# Patient Record
Sex: Female | Born: 1955 | Race: White | Hispanic: No | Smoking: Never smoker
Health system: Southern US, Community
[De-identification: ages and names within clinical notes are randomized; demographics above are authoritative.]

## PROBLEM LIST (undated history)

## (undated) DIAGNOSIS — F32A Depression, unspecified: Secondary | ICD-10-CM

## (undated) DIAGNOSIS — D696 Thrombocytopenia, unspecified: Secondary | ICD-10-CM

## (undated) DIAGNOSIS — N12 Tubulo-interstitial nephritis, not specified as acute or chronic: Secondary | ICD-10-CM

## (undated) DIAGNOSIS — F419 Anxiety disorder, unspecified: Secondary | ICD-10-CM

## (undated) DIAGNOSIS — K219 Gastro-esophageal reflux disease without esophagitis: Secondary | ICD-10-CM

## (undated) DIAGNOSIS — F329 Major depressive disorder, single episode, unspecified: Secondary | ICD-10-CM

## (undated) DIAGNOSIS — H547 Unspecified visual loss: Secondary | ICD-10-CM

## (undated) DIAGNOSIS — M810 Age-related osteoporosis without current pathological fracture: Secondary | ICD-10-CM

## (undated) DIAGNOSIS — E119 Type 2 diabetes mellitus without complications: Secondary | ICD-10-CM

## (undated) DIAGNOSIS — Z8719 Personal history of other diseases of the digestive system: Secondary | ICD-10-CM

## (undated) DIAGNOSIS — J302 Other seasonal allergic rhinitis: Secondary | ICD-10-CM

## (undated) DIAGNOSIS — G709 Myoneural disorder, unspecified: Secondary | ICD-10-CM

## (undated) DIAGNOSIS — R519 Headache, unspecified: Secondary | ICD-10-CM

## (undated) DIAGNOSIS — M1990 Unspecified osteoarthritis, unspecified site: Secondary | ICD-10-CM

## (undated) DIAGNOSIS — I10 Essential (primary) hypertension: Secondary | ICD-10-CM

## (undated) DIAGNOSIS — I639 Cerebral infarction, unspecified: Secondary | ICD-10-CM

## (undated) DIAGNOSIS — J189 Pneumonia, unspecified organism: Secondary | ICD-10-CM

## (undated) DIAGNOSIS — I4719 Other supraventricular tachycardia: Secondary | ICD-10-CM

## (undated) DIAGNOSIS — G250 Essential tremor: Secondary | ICD-10-CM

## (undated) DIAGNOSIS — E785 Hyperlipidemia, unspecified: Secondary | ICD-10-CM

## (undated) HISTORY — PX: CARDIAC VALVE REPLACEMENT: SHX585

## (undated) HISTORY — PX: OTHER SURGICAL HISTORY: SHX169

## (undated) HISTORY — PX: FRACTURE SURGERY: SHX138

## (undated) HISTORY — PX: BREAST CYST ASPIRATION: SHX578

## (undated) HISTORY — PX: TUBAL LIGATION: SHX77

---

## 2004-11-12 HISTORY — PX: CARDIAC VALVE REPLACEMENT: SHX585

## 2005-03-12 ENCOUNTER — Ambulatory Visit: Payer: Self-pay | Admitting: Internal Medicine

## 2005-03-30 ENCOUNTER — Emergency Department: Payer: Self-pay | Admitting: Internal Medicine

## 2005-11-21 ENCOUNTER — Ambulatory Visit: Payer: Self-pay

## 2005-11-26 ENCOUNTER — Ambulatory Visit: Payer: Self-pay

## 2005-12-21 ENCOUNTER — Other Ambulatory Visit: Payer: Self-pay

## 2005-12-25 ENCOUNTER — Ambulatory Visit: Payer: Self-pay | Admitting: Obstetrics & Gynecology

## 2008-06-15 ENCOUNTER — Ambulatory Visit: Payer: Self-pay | Admitting: Internal Medicine

## 2009-01-17 ENCOUNTER — Encounter: Payer: Self-pay | Admitting: Orthopedic Surgery

## 2009-02-10 ENCOUNTER — Encounter: Payer: Self-pay | Admitting: Orthopedic Surgery

## 2011-06-25 ENCOUNTER — Ambulatory Visit: Payer: Self-pay | Admitting: Family Medicine

## 2012-08-06 ENCOUNTER — Ambulatory Visit (HOSPITAL_COMMUNITY)
Admission: RE | Admit: 2012-08-06 | Discharge: 2012-08-06 | Disposition: A | Discharge status: Home / Self Care | Payer: BC Managed Care – PPO | Source: Ambulatory Visit | Attending: Family Medicine | Admitting: Family Medicine

## 2012-08-06 ENCOUNTER — Ambulatory Visit (HOSPITAL_COMMUNITY): Payer: Self-pay

## 2012-08-06 ENCOUNTER — Other Ambulatory Visit (HOSPITAL_COMMUNITY): Payer: Self-pay | Admitting: Family Medicine

## 2012-08-06 ENCOUNTER — Other Ambulatory Visit (HOSPITAL_COMMUNITY): Payer: Self-pay | Admitting: General Practice

## 2012-08-06 DIAGNOSIS — R1084 Generalized abdominal pain: Secondary | ICD-10-CM

## 2012-08-06 DIAGNOSIS — R109 Unspecified abdominal pain: Secondary | ICD-10-CM

## 2012-08-07 ENCOUNTER — Other Ambulatory Visit (HOSPITAL_COMMUNITY): Payer: Self-pay | Admitting: Internal Medicine

## 2012-08-08 ENCOUNTER — Ambulatory Visit (HOSPITAL_COMMUNITY): Payer: BC Managed Care – PPO

## 2012-08-08 ENCOUNTER — Other Ambulatory Visit (HOSPITAL_COMMUNITY): Payer: BC Managed Care – PPO

## 2012-08-08 ENCOUNTER — Other Ambulatory Visit (HOSPITAL_COMMUNITY): Payer: Self-pay

## 2012-09-04 ENCOUNTER — Telehealth: Payer: Self-pay

## 2012-09-04 NOTE — Telephone Encounter (Signed)
LMOM to call.

## 2012-09-22 NOTE — Telephone Encounter (Signed)
LMOM to call.

## 2012-09-23 NOTE — Telephone Encounter (Signed)
Letter to pt and PCP.  

## 2013-11-16 ENCOUNTER — Telehealth: Payer: Self-pay

## 2013-11-16 NOTE — Telephone Encounter (Signed)
Pt was referred by Dr. Georganna [facility]Amelia Wilson [facility] for [facility]. Home number many rings and no answer. LMOM for a return call on [phone].

## 2013-11-30 ENCOUNTER — Telehealth: Payer: Self-pay | Admitting: *Deleted

## 2013-11-30 NOTE — Telephone Encounter (Signed)
Pt called wanting to schedule her colonoscopy. Please advise [phone] or cell [phone] [facility] if she does not answer.

## 2013-12-04 NOTE — Telephone Encounter (Signed)
LMOM to call.

## 2013-12-08 NOTE — Telephone Encounter (Signed)
LMOM to call on both numbers.

## 2013-12-10 ENCOUNTER — Other Ambulatory Visit: Payer: Self-pay

## 2013-12-10 DIAGNOSIS — Z1211 Encounter for screening for malignant neoplasm of colon: Secondary | ICD-10-CM

## 2013-12-10 NOTE — Telephone Encounter (Signed)
Returned pt's call. LMOM for a return call.

## 2013-12-11 NOTE — Telephone Encounter (Signed)
MOVI PREP SPLIT DOSING, REGULAR BREAKFAST. CLEAR LIQUIDS AFTER 9 AM.  

## 2013-12-11 NOTE — Telephone Encounter (Signed)
Gastroenterology Pre-Procedure Review  Request Date: 12/10/2013 Requesting Physician: Dr. Georganna SkeansAmelia Wilson at [facility] Pt's first colonoscopy  PATIENT REVIEW QUESTIONS: The patient responded to the following health history questions as indicated:    1. Diabetes Melitis: no 2. Joint replacements in the past 12 months: no 3. Major health problems in the past 3 months: no 4. Has an artificial valve or MVP: pt had mitral valve repair in 2006 5. Has a defibrillator: no 6. Has been advised in past to take antibiotics in advance of a procedure like teeth cleaning: Yes in the past, but no longer    MEDICATIONS & ALLERGIES:    Patient reports the following regarding taking any blood thinners:   Plavix? no Aspirin? YES Coumadin? no  Patient confirms/reports the following medications:  Current Outpatient Prescriptions  Medication Sig Dispense Refill  . alendronate (FOSAMAX) 70 MG tablet Take 70 mg by mouth once a week. Take with a full glass of water on an empty stomach.      Marland Kitchen. aspirin 81 MG tablet Take 81 mg by mouth daily.      . benazepril (LOTENSIN) 40 MG tablet Take 40 mg by mouth daily.      Marland Kitchen. loratadine (CLARITIN) 10 MG tablet Take 10 mg by mouth daily.      . Multiple Vitamin (MULTIVITAMIN) tablet Take 1 tablet by mouth daily.      . NON FORMULARY Calcium 600 + D    BID      . Omega-3 Fatty Acids (FISH OIL) 1000 MG CPDR Take by mouth 2 (two) times daily.      . pindolol (VISKEN) 5 MG tablet Take 5 mg by mouth 2 (two) times daily.      . sertraline (ZOLOFT) 100 MG tablet Take 100 mg by mouth daily.      . simvastatin (ZOCOR) 40 MG tablet Take 40 mg by mouth daily.       No current facility-administered medications for this visit.    Patient confirms/reports the following allergies:  No Known Allergies  No orders of the defined types were placed in this encounter.    AUTHORIZATION INFORMATION Primary Insurance:   ID #: Group #:  Pre-Cert / Auth required:   Pre-Cert / Auth #:   Secondary Insurance:   ID #:   Group #:  Pre-Cert / Auth required: Pre-Cert / Auth #:   SCHEDULE INFORMATION: Procedure has been scheduled as follows:  Date:  12/28/2013          Time:  10:45 AM Location: [facility] short Stay  This Gastroenterology Pre-Precedure Review Form is being routed to the following provider(s): Jonette EvaSandi Fields, MD

## 2013-12-14 MED ORDER — PEG-KCL-NACL-NASULF-NA ASC-C 100 G PO SOLR: 1.0000 | ORAL | Status: DC

## 2013-12-14 NOTE — Telephone Encounter (Signed)
See separate triage.  

## 2013-12-14 NOTE — Telephone Encounter (Signed)
Rx sent to the pharmacy and instructions mailed to pt.  

## 2013-12-21 ENCOUNTER — Encounter (HOSPITAL_COMMUNITY): Payer: Self-pay | Admitting: Pharmacy Technician

## 2013-12-28 ENCOUNTER — Ambulatory Visit (HOSPITAL_COMMUNITY)
Admission: RE | Admit: 2013-12-28 | Discharge: 2013-12-28 | Disposition: A | Discharge status: Home / Self Care | Payer: BC Managed Care – PPO | Source: Ambulatory Visit | Attending: Gastroenterology | Admitting: Gastroenterology

## 2013-12-28 ENCOUNTER — Encounter (HOSPITAL_COMMUNITY)
Admission: RE | Disposition: A | Discharge status: Home / Self Care | Payer: Self-pay | Source: Ambulatory Visit | Attending: Gastroenterology

## 2013-12-28 ENCOUNTER — Encounter (HOSPITAL_COMMUNITY): Payer: Self-pay | Admitting: *Deleted

## 2013-12-28 DIAGNOSIS — K5730 Diverticulosis of large intestine without perforation or abscess without bleeding: Secondary | ICD-10-CM | POA: Insufficient documentation

## 2013-12-28 DIAGNOSIS — Q438 Other specified congenital malformations of intestine: Secondary | ICD-10-CM | POA: Insufficient documentation

## 2013-12-28 DIAGNOSIS — E785 Hyperlipidemia, unspecified: Secondary | ICD-10-CM | POA: Insufficient documentation

## 2013-12-28 DIAGNOSIS — Z7982 Long term (current) use of aspirin: Secondary | ICD-10-CM | POA: Insufficient documentation

## 2013-12-28 DIAGNOSIS — Z1211 Encounter for screening for malignant neoplasm of colon: Secondary | ICD-10-CM | POA: Insufficient documentation

## 2013-12-28 DIAGNOSIS — I10 Essential (primary) hypertension: Secondary | ICD-10-CM | POA: Insufficient documentation

## 2013-12-28 DIAGNOSIS — K648 Other hemorrhoids: Secondary | ICD-10-CM | POA: Insufficient documentation

## 2013-12-28 HISTORY — DX: Essential (primary) hypertension: I10

## 2013-12-28 HISTORY — DX: Personal history of other diseases of the digestive system: Z87.19

## 2013-12-28 HISTORY — DX: Anxiety disorder, unspecified: F41.9

## 2013-12-28 HISTORY — DX: Other seasonal allergic rhinitis: J30.2

## 2013-12-28 HISTORY — PX: COLONOSCOPY: SHX5424

## 2013-12-28 HISTORY — DX: Major depressive disorder, single episode, unspecified: F32.9

## 2013-12-28 HISTORY — DX: Age-related osteoporosis without current pathological fracture: M81.0

## 2013-12-28 HISTORY — DX: Depression, unspecified: F32.A

## 2013-12-28 HISTORY — DX: Gastro-esophageal reflux disease without esophagitis: K21.9

## 2013-12-28 HISTORY — DX: Hyperlipidemia, unspecified: E78.5

## 2013-12-28 SURGERY — COLONOSCOPY
Anesthesia: Moderate Sedation

## 2013-12-28 MED ORDER — MEPERIDINE HCL 100 MG/ML IJ SOLN
INTRAMUSCULAR | Status: AC
  Filled 2013-12-28: qty 2

## 2013-12-28 MED ORDER — MEPERIDINE HCL 100 MG/ML IJ SOLN
INTRAMUSCULAR | Status: DC | PRN
  Administered 2013-12-28: 25 mg via INTRAVENOUS
  Administered 2013-12-28: 50 mg via INTRAVENOUS
  Administered 2013-12-28: 25 mg via INTRAVENOUS

## 2013-12-28 MED ORDER — STERILE WATER FOR IRRIGATION IR SOLN
Status: DC | PRN
  Administered 2013-12-28: 10:00:00

## 2013-12-28 MED ORDER — MIDAZOLAM HCL 5 MG/5ML IJ SOLN
INTRAMUSCULAR | Status: AC
  Filled 2013-12-28: qty 10

## 2013-12-28 MED ORDER — SODIUM CHLORIDE 0.9 % IJ SOLN
INTRAMUSCULAR | Status: AC
  Filled 2013-12-28: qty 10

## 2013-12-28 MED ORDER — SODIUM CHLORIDE 0.9 % IV SOLN
INTRAVENOUS | Status: DC
  Administered 2013-12-28: 1000 mL via INTRAVENOUS

## 2013-12-28 MED ORDER — MIDAZOLAM HCL 5 MG/5ML IJ SOLN
INTRAMUSCULAR | Status: DC | PRN
  Administered 2013-12-28: 1 mg via INTRAVENOUS
  Administered 2013-12-28 (×2): 2 mg via INTRAVENOUS

## 2013-12-28 NOTE — H&P (Signed)
Primary Care Physician:  Becky Sax, MD Primary Gastroenterologist:  Dr. Oneida Alar  Pre-Procedure History & Physical: HPI:  Kathleen Frazier is a 58 y.o. female here for Plato.  Past Medical History  Diagnosis Date  . Depression   . H/O hiatal hernia   . GERD (gastroesophageal reflux disease)   . Anxiety   . Osteoporosis   . Hyperlipemia   . Hypertension   . Seasonal allergies     Past Surgical History  Procedure Laterality Date  . Cardiac valve replacement      mitral valve replacement  . Right leg has rod and screws      Prior to Admission medications   Medication Sig Start Date End Date Taking? Authorizing Provider  alendronate (FOSAMAX) 70 MG tablet Take 70 mg by mouth once a week. Take with a full glass of water on an empty stomach.   Yes Historical Provider, MD  aspirin 81 MG tablet Take 81 mg by mouth daily.   Yes Historical Provider, MD  benazepril (LOTENSIN) 40 MG tablet Take 40 mg by mouth daily.   Yes Historical Provider, MD  loratadine (CLARITIN) 10 MG tablet Take 10 mg by mouth daily.   Yes Historical Provider, MD  Multiple Vitamin (MULTIVITAMIN) tablet Take 1 tablet by mouth daily.   Yes Historical Provider, MD  NON FORMULARY Calcium 600 + D    BID   Yes Historical Provider, MD  Omega-3 Fatty Acids (FISH OIL) 1000 MG CPDR Take by mouth 2 (two) times daily.   Yes Historical Provider, MD  pantoprazole (PROTONIX) 40 MG tablet Take 40 mg by mouth daily.   Yes Historical Provider, MD  peg 3350 powder (MOVIPREP) 100 G SOLR Take 1 kit (200 g total) by mouth as directed. 12/14/13  Yes Danie Binder, MD  pindolol (VISKEN) 5 MG tablet Take 5 mg by mouth daily.    Yes Historical Provider, MD  sertraline (ZOLOFT) 100 MG tablet Take 100 mg by mouth daily.   Yes Historical Provider, MD  simvastatin (ZOCOR) 40 MG tablet Take 40 mg by mouth every other day.    Yes Historical Provider, MD    Allergies as of 12/10/2013  . (Not on File)    History reviewed.  No pertinent family history.  History   Social History  . Marital Status: Divorced    Spouse Name: N/A    Number of Children: N/A  . Years of Education: N/A   Occupational History  . Not on file.   Social History Main Topics  . Smoking status: Never Smoker   . Smokeless tobacco: Not on file  . Alcohol Use: Not on file  . Drug Use: Not on file  . Sexual Activity: Not on file   Other Topics Concern  . Not on file   Social History Narrative  . No narrative on file    Review of Systems: See HPI, otherwise negative ROS   Physical Exam: BP 144/97  Pulse 104  Temp(Src) 99 F (37.2 C) (Oral)  Resp 24  Ht 5' 2"  (1.575 m)  Wt 168 lb (76.204 kg)  BMI 30.72 kg/m2  SpO2 96% General:   Alert,  pleasant and cooperative in NAD Head:  Normocephalic and atraumatic. Neck:  Supple; Lungs:  Clear throughout to auscultation.    Heart:  Regular rate and rhythm. Abdomen:  Soft, nontender and nondistended. Normal bowel sounds, without guarding, and without rebound.   Neurologic:  Alert and  oriented x4;  grossly  normal neurologically.  Impression/Plan:     SCREENING  Plan:  1. TCS TODAY

## 2013-12-28 NOTE — Op Note (Signed)
[facility] [address] [place], [id number]   COLONOSCOPY PROCEDURE REPORT  PATIENT: Kathleen Frazier, Kathleen T.  MR#: [id number] BIRTHDATE: 08-Jan-1956 , 57  yrs. old GENDER: Female ENDOSCOPIST: Jonette EvaSandi Fields, MD REFERRED BY:   Tommie RaymondAMELIA P WILSON, MD, [place], [place] PROCEDURE DATE:  12/28/2013 PROCEDURE:   Colonoscopy, screening INDICATIONS:Average risk patient for colon cancer. MEDICATIONS: Demerol 100 mg IV and Versed 5 mg IV  DESCRIPTION OF PROCEDURE:    Physical exam was performed.  Informed consent was obtained from the patient after explaining the benefits, risks, and alternatives to procedure.  The patient was connected to monitor and placed in left lateral position. Continuous oxygen was provided by nasal cannula and IV medicine administered through an indwelling cannula.  After administration of sedation and rectal exam, the patients rectum was intubated and the EC-3890Li ([id number]  colonoscope was advanced under direct visualization to the ileum.  The scope was removed slowly by carefully examining the color, texture, anatomy, and integrity mucosa on the way out.  The patient was recovered in endoscopy and discharged home in satisfactory condition.    COLON FINDINGS: The mucosa appeared normal in the terminal ileum.  , The SIGMOID AND TRANSVERSE colon ARE redundant.  Manual abdominal counter-pressure was used to reach the cecum.  The patient was moved on to their back to reach the cecum, Mild diverticulosis was noted in the SIGMOID colon.  Small internal hemorrhoids were found.   PREP QUALITY: good.  CECAL W/D TIME: 8 minutes     COMPLICATIONS: None  ENDOSCOPIC IMPRESSION: 1.   Normal mucosa in the terminal ileum 2.   The colon IS redundant 3.   Mild diverticulosis  in the SIGMOID colon 4.   Small internal hemorrhoids  RECOMMENDATIONS: HIGH FIBER DIET TCS IN 10 YEARS WITH AN OVERTUBE       _______________________________ eSignedJonette Eva:  Sandi Fields, MD  12/28/2013 2:59 PM

## 2013-12-28 NOTE — Discharge Instructions (Signed)
You have small internal hemorrhoids and diverticulosis in your left colon. YOU HAVE A FLOPPY COLON.   FOLLOW A HIGH FIBER DIET. AVOID ITEMS THAT CAUSE BLOATING & GAS. SEE INFO BELOW.  Next colonoscopy in 10 years WITH AN OVERTUBE.   Colonoscopy Care After Read the instructions outlined below and refer to this sheet in the next week. These discharge instructions provide you with general information on caring for yourself after you leave the hospital. While your treatment has been planned according to the most current medical practices available, unavoidable complications occasionally occur. If you have any problems or questions after discharge, call DR. Ozzy Bohlken, [phone].  ACTIVITY  You may resume your regular activity, but move at a slower pace for the next 24 hours.   Take frequent rest periods for the next 24 hours.   Walking will help get rid of the air and reduce the bloated feeling in your belly (abdomen).   No driving for 24 hours (because of the medicine (anesthesia) used during the test).   You may shower.   Do not sign any important legal documents or operate any machinery for 24 hours (because of the anesthesia used during the test).    NUTRITION  Drink plenty of fluids.   You may resume your normal diet as instructed by your doctor.   Begin with a light meal and progress to your normal diet. Heavy or fried foods are harder to digest and may make you feel sick to your stomach (nauseated).   Avoid alcoholic beverages for 24 hours or as instructed.    MEDICATIONS  You may resume your normal medications.   WHAT YOU CAN EXPECT TODAY  Some feelings of bloating in the abdomen.   Passage of more gas than usual.   Spotting of blood in your stool or on the toilet paper  .  IF YOU HAD POLYPS REMOVED DURING THE COLONOSCOPY:  Eat a soft diet IF YOU HAVE NAUSEA, BLOATING, ABDOMINAL PAIN, OR VOMITING.    FINDING OUT THE RESULTS OF YOUR TEST Not all test  results are available during your visit. DR. Darrick Penna WILL CALL YOU WITHIN 7 DAYS OF YOUR PROCEDUE WITH YOUR RESULTS. Do not assume everything is normal if you have not heard from DR. Carrieanne Kleen IN ONE WEEK, CALL HER OFFICE AT [phone].  SEEK IMMEDIATE MEDICAL ATTENTION AND CALL THE OFFICE: [phone] IF:  You have more than a spotting of blood in your stool.   Your belly is swollen (abdominal distention).   You are nauseated or vomiting.   You have a temperature over 101F.   You have abdominal pain or discomfort that is severe or gets worse throughout the day.  High-Fiber Diet A high-fiber diet changes your normal diet to include more whole grains, legumes, fruits, and vegetables. Changes in the diet involve replacing refined carbohydrates with unrefined foods. The calorie level of the diet is essentially unchanged. The Dietary Reference Intake (recommended amount) for adult males is 38 grams per day. For adult females, it is 25 grams per day. Pregnant and lactating women should consume 28 grams of fiber per day. Fiber is the intact part of a plant that is not broken down during digestion. Functional fiber is fiber that has been isolated from the plant to provide a beneficial effect in the body. PURPOSE  Increase stool bulk.   Ease and regulate bowel movements.   Lower cholesterol.  INDICATIONS THAT YOU NEED MORE FIBER  Constipation and hemorrhoids.   Uncomplicated diverticulosis (intestine condition)  and irritable bowel syndrome.   Weight management.   As a protective measure against hardening of the arteries (atherosclerosis), diabetes, and cancer.   GUIDELINES FOR INCREASING FIBER IN THE DIET  Start adding fiber to the diet slowly. A gradual increase of about 5 more grams (2 slices of whole-wheat bread, 2 servings of most fruits or vegetables, or 1 bowl of high-fiber cereal) per day is best. Too rapid an increase in fiber may result in constipation, flatulence, and bloating.     Drink enough water and fluids to keep your urine clear or pale yellow. Water, juice, or caffeine-free drinks are recommended. Not drinking enough fluid may cause constipation.   Eat a variety of high-fiber foods rather than one type of fiber.   Try to increase your intake of fiber through using high-fiber foods rather than fiber pills or supplements that contain small amounts of fiber.   The goal is to change the types of food eaten. Do not supplement your present diet with high-fiber foods, but replace foods in your present diet.  INCLUDE A VARIETY OF FIBER SOURCES  Replace refined and processed grains with whole grains, canned fruits with fresh fruits, and incorporate other fiber sources. White rice, white breads, and most bakery goods contain little or no fiber.   Brown whole-grain rice, buckwheat oats, and many fruits and vegetables are all good sources of fiber. These include: broccoli, Brussels sprouts, cabbage, cauliflower, beets, sweet potatoes, white potatoes (skin on), carrots, tomatoes, eggplant, squash, berries, fresh fruits, and dried fruits.   Cereals appear to be the richest source of fiber. Cereal fiber is found in whole grains and bran. Bran is the fiber-rich outer coat of cereal grain, which is largely removed in refining. In whole-grain cereals, the bran remains. In breakfast cereals, the largest amount of fiber is found in those with "bran" in their names. The fiber content is sometimes indicated on the label.   You may need to include additional fruits and vegetables each day.   In baking, for 1 cup white flour, you may use the following substitutions:   1 cup whole-wheat flour minus 2 tablespoons.   1/2 cup white flour plus 1/2 cup whole-wheat flour.   Diverticulosis Diverticulosis is a common condition that develops when small pouches (diverticula) form in the wall of the colon. The risk of diverticulosis increases with age. It happens more often in people who eat a  low-fiber diet. Most individuals with diverticulosis have no symptoms. Those individuals with symptoms usually experience belly (abdominal) pain, constipation, or loose stools (diarrhea).  HOME CARE INSTRUCTIONS  Increase the amount of fiber in your diet as directed by your caregiver or dietician. This may reduce symptoms of diverticulosis.   Drink at least 6 to 8 glasses of water each day to prevent constipation.   Try not to strain when you have a bowel movement.   Avoiding nuts and seeds to prevent complications is still an uncertain benefit.       FOODS HAVING HIGH FIBER CONTENT INCLUDE:  Fruits. Apple, peach, pear, tangerine, raisins, prunes.   Vegetables. Brussels sprouts, asparagus, broccoli, cabbage, carrot, cauliflower, romaine lettuce, spinach, summer squash, tomato, winter squash, zucchini.   Starchy Vegetables. Baked beans, kidney beans, lima beans, split peas, lentils, potatoes (with skin).   Grains. Whole wheat bread, brown rice, bran flake cereal, plain oatmeal, white rice, shredded wheat, bran muffins.    SEEK IMMEDIATE MEDICAL CARE IF:  You develop increasing pain or severe bloating.   You have  an oral temperature above 101F.   You develop vomiting or bowel movements that are bloody or black.   Hemorrhoids Hemorrhoids are dilated (enlarged) veins around the rectum. Sometimes clots will form in the veins. This makes them swollen and painful. These are called thrombosed hemorrhoids. Causes of hemorrhoids include:  Constipation.   Straining to have a bowel movement.   HEAVY LIFTING HOME CARE INSTRUCTIONS  Eat a well balanced diet and drink 6 to 8 glasses of water every day to avoid constipation. You may also use a bulk laxative.   Avoid straining to have bowel movements.   Keep anal area dry and clean.   Do not use a donut shaped pillow or sit on the toilet for long periods. This increases blood pooling and pain.   Move your bowels when your body has  the urge; this will require less straining and will decrease pain and pressure.

## 2013-12-30 ENCOUNTER — Encounter (HOSPITAL_COMMUNITY): Payer: Self-pay | Admitting: Gastroenterology

## 2014-11-15 ENCOUNTER — Ambulatory Visit: Payer: Self-pay | Admitting: Physician Assistant

## 2016-05-21 ENCOUNTER — Other Ambulatory Visit: Payer: Self-pay | Admitting: Endocrinology

## 2016-05-21 ENCOUNTER — Other Ambulatory Visit: Payer: Self-pay | Admitting: Physician Assistant

## 2016-05-21 DIAGNOSIS — Z1231 Encounter for screening mammogram for malignant neoplasm of breast: Secondary | ICD-10-CM

## 2016-06-06 ENCOUNTER — Other Ambulatory Visit: Payer: Self-pay | Admitting: Physician Assistant

## 2016-06-06 ENCOUNTER — Ambulatory Visit
Admission: RE | Admit: 2016-06-06 | Discharge: 2016-06-06 | Disposition: A | Discharge status: Home / Self Care | Payer: BC Managed Care – PPO | Source: Ambulatory Visit | Attending: Physician Assistant | Admitting: Physician Assistant

## 2016-06-06 DIAGNOSIS — Z1231 Encounter for screening mammogram for malignant neoplasm of breast: Secondary | ICD-10-CM | POA: Insufficient documentation

## 2017-07-11 ENCOUNTER — Other Ambulatory Visit: Payer: Self-pay | Admitting: Physician Assistant

## 2017-07-11 DIAGNOSIS — Z1231 Encounter for screening mammogram for malignant neoplasm of breast: Secondary | ICD-10-CM

## 2017-07-25 ENCOUNTER — Ambulatory Visit
Admission: RE | Admit: 2017-07-25 | Discharge: 2017-07-25 | Disposition: A | Discharge status: Home / Self Care | Payer: BC Managed Care – PPO | Source: Ambulatory Visit | Attending: Physician Assistant | Admitting: Physician Assistant

## 2017-07-25 DIAGNOSIS — Z1231 Encounter for screening mammogram for malignant neoplasm of breast: Secondary | ICD-10-CM | POA: Diagnosis present

## 2018-06-02 ENCOUNTER — Other Ambulatory Visit: Payer: Self-pay | Admitting: Family

## 2018-06-02 DIAGNOSIS — M810 Age-related osteoporosis without current pathological fracture: Secondary | ICD-10-CM

## 2018-06-18 ENCOUNTER — Ambulatory Visit
Admission: RE | Admit: 2018-06-18 | Discharge: 2018-06-18 | Disposition: A | Discharge status: Home / Self Care | Payer: BC Managed Care – PPO | Source: Ambulatory Visit | Attending: Family | Admitting: Family

## 2018-06-18 DIAGNOSIS — M810 Age-related osteoporosis without current pathological fracture: Secondary | ICD-10-CM | POA: Diagnosis not present

## 2020-06-02 ENCOUNTER — Other Ambulatory Visit: Payer: Self-pay | Admitting: Family

## 2020-06-02 DIAGNOSIS — Z1231 Encounter for screening mammogram for malignant neoplasm of breast: Secondary | ICD-10-CM

## 2020-08-25 ENCOUNTER — Ambulatory Visit
Admission: RE | Admit: 2020-08-25 | Discharge: 2020-08-25 | Disposition: A | Discharge status: Home / Self Care | Payer: Medicare PPO | Source: Ambulatory Visit | Attending: Family | Admitting: Family

## 2020-08-25 ENCOUNTER — Other Ambulatory Visit: Payer: Self-pay

## 2020-08-25 DIAGNOSIS — Z1231 Encounter for screening mammogram for malignant neoplasm of breast: Secondary | ICD-10-CM | POA: Insufficient documentation

## 2022-01-08 ENCOUNTER — Ambulatory Visit: Payer: Medicare PPO | Admitting: Neurology

## 2022-01-08 VITALS — BP 136/89 | HR 90 | Ht 62.0 in | Wt 147.0 lb

## 2022-01-08 DIAGNOSIS — F329 Major depressive disorder, single episode, unspecified: Secondary | ICD-10-CM | POA: Insufficient documentation

## 2022-01-08 DIAGNOSIS — G250 Essential tremor: Secondary | ICD-10-CM | POA: Diagnosis not present

## 2022-01-08 DIAGNOSIS — E782 Mixed hyperlipidemia: Secondary | ICD-10-CM | POA: Insufficient documentation

## 2022-01-08 DIAGNOSIS — I10 Essential (primary) hypertension: Secondary | ICD-10-CM | POA: Insufficient documentation

## 2022-01-08 DIAGNOSIS — E1165 Type 2 diabetes mellitus with hyperglycemia: Secondary | ICD-10-CM | POA: Insufficient documentation

## 2022-01-08 DIAGNOSIS — K219 Gastro-esophageal reflux disease without esophagitis: Secondary | ICD-10-CM | POA: Insufficient documentation

## 2022-01-08 DIAGNOSIS — E119 Type 2 diabetes mellitus without complications: Secondary | ICD-10-CM | POA: Insufficient documentation

## 2022-01-08 DIAGNOSIS — M810 Age-related osteoporosis without current pathological fracture: Secondary | ICD-10-CM | POA: Insufficient documentation

## 2022-01-08 MED ORDER — PROPRANOLOL HCL 20 MG PO TABS: 20.0000 mg | ORAL_TABLET | Freq: Two times a day (BID) | ORAL | 11 refills | Status: DC | PRN

## 2022-01-08 NOTE — Progress Notes (Signed)
Chief Complaint  Patient presents with   New Patient (Initial Visit)    Rm 14, alone  NP/Paper/[facility]/Gina Collins FNP [phone] tremor worsening Tremors worse in hands and head,        ASSESSMENT AND PLAN  Bryttney Netzer is a 66 y.o. female   Essential tremor  Strong family history, her father and sister has similar tremors,  She also has titubation, mild voice tremor, bilateral hands posturing tremor, more of a nuisance to her, does not pose significant limitation,  Inderal 10 to 20 mg as needed   DIAGNOSTIC DATA (LABS, IMAGING, TESTING) - I reviewed patient records, labs, notes, testing and imaging myself where available.  Labs in Dec TSH, Vit D 42  MEDICAL HISTORY:  Asees Manfredi, 66 year old female seen in request by her nurse practitioner Coral Ceo L for evaluation of tremor  I reviewed and summarized the referring note. PMHx. HTN HLD Mitral Valve repair Depression  She is a retired [employer] loss adjuster, moderate pain years ago, she was reminded by her student, that she has mild bilateral hands tremor, head titubation, herself does not realize any limitations  Over the years her tremor gradually getting worse, now she has to hold a glass of water with 2 hands, difficulty writing, also noticed mild tremors in her voice  She denied loss sense of smell, denies gait abnormality,  She has strong family history of tremor, her father, and sister suffered tremor, Normal thyroid functional test  PHYSICAL EXAM:   Vitals:   01/08/22 1402  BP: 136/89  Pulse: 90  Weight: 147 lb (66.7 kg)  Height: 5\' 2"  (1.575 m)   Not recorded     Body mass index is 26.89 kg/m.  PHYSICAL EXAMNIATION:  Gen: NAD, conversant, well nourised, well groomed                     Cardiovascular: Regular rate rhythm, no peripheral edema, warm, nontender. Eyes: Conjunctivae clear without exudates or hemorrhage Neck: Supple, no carotid  bruits. Pulmonary: Clear to auscultation bilaterally   NEUROLOGICAL EXAM:  MENTAL STATUS: Speech:    Speech is normal; fluent and spontaneous with normal comprehension.  Cognition:     Orientation to time, place and person     Normal recent and remote memory     Normal Attention span and concentration     Normal Language, naming, repeating,spontaneous speech     Fund of knowledge   CRANIAL NERVES: CN II: Visual fields are full to confrontation. Pupils are round equal and briskly reactive to light. CN III, IV, VI: extraocular movement are normal. No ptosis. CN V: Facial sensation is intact to light touch CN VII: Face is symmetric with normal eye closure  CN VIII: Hearing is normal to causal conversation. CN IX, X: Phonation is normal. CN XI: Head turning and shoulder shrug are intact  MOTOR: Mild head titubation, mild bilateral hands posturing tremor, no rigidity, no bradykinesia, normal strength, mild tremor drawing spiral circle  REFLEXES: Reflexes are 2+ and symmetric at the biceps, triceps, knees, and ankles. Plantar responses are flexor.  SENSORY: Intact to light touch, pinprick and vibratory sensation are intact in fingers and toes.  COORDINATION: There is no trunk or limb dysmetria noted.  GAIT/STANCE: Posture is normal. Gait is steady with normal steps, base, arm swing, and turning. Heel and toe walking are normal. Tandem gait is normal.  Romberg is absent.  REVIEW OF SYSTEMS:  Full 14 system review of systems performed  and notable only for as above All other review of systems were negative.   ALLERGIES: No Known Allergies  HOME MEDICATIONS: Current Outpatient Medications  Medication Sig Dispense Refill   alendronate (FOSAMAX) 70 MG tablet Take 70 mg by mouth once a week. Take with a full glass of water on an empty stomach.     aspirin 81 MG tablet Take 81 mg by mouth daily.     benazepril (LOTENSIN) 40 MG tablet Take 40 mg by mouth daily.     loratadine  (CLARITIN) 10 MG tablet Take 10 mg by mouth daily.     Multiple Vitamin (MULTIVITAMIN) tablet Take 1 tablet by mouth daily.     NON FORMULARY Calcium 600 + D    BID     Omega-3 Fatty Acids (FISH OIL) 1000 MG CPDR Take by mouth 2 (two) times daily.     pantoprazole (PROTONIX) 40 MG tablet Take 40 mg by mouth daily.     pindolol (VISKEN) 5 MG tablet Take 5 mg by mouth daily.      Sertraline HCl 150 MG CAPS Take 100 mg by mouth daily.     simvastatin (ZOCOR) 40 MG tablet Take 40 mg by mouth every other day.      No current facility-administered medications for this visit.    PAST MEDICAL HISTORY: Past Medical History:  Diagnosis Date   Anxiety    Depression    GERD (gastroesophageal reflux disease)    H/O hiatal hernia    Hyperlipemia    Hypertension    Osteoporosis    Seasonal allergies     PAST SURGICAL HISTORY: Past Surgical History:  Procedure Laterality Date   BREAST CYST ASPIRATION  30+ years ago   CARDIAC VALVE REPLACEMENT     mitral valve replacement   COLONOSCOPY N/A 12/28/2013   Procedure: COLONOSCOPY;  Surgeon: West Bali, MD;  Location: [facility];  Service: Endoscopy;  Laterality: N/A;  10:45 AM   right leg has rod and screws      FAMILY HISTORY: Family History  Problem Relation Age of Onset   Breast cancer Neg Hx     SOCIAL HISTORY: Social History   Socioeconomic History   Marital status: Divorced    Spouse name: Not on file   Number of children: Not on file   Years of education: Not on file   Highest education level: Not on file  Occupational History   Not on file  Tobacco Use   Smoking status: Never   Smokeless tobacco: Not on file  Substance and Sexual Activity   Alcohol use: Not on file   Drug use: Not on file   Sexual activity: Not on file  Other Topics Concern   Not on file  Social History Narrative   Not on file   Social Determinants of Health   Financial Resource Strain: Not on file  Food Insecurity: Not on file   Transportation Needs: Not on file  Physical Activity: Not on file  Stress: Not on file  Social Connections: Not on file  Intimate Partner Violence: Not on file      Levert Feinstein, M.D. Ph.D.  [facility] [address] [place], [place] [address] Ph: [phone] Fax: [phone]  CC:  Wilmon Pali, FNP [address] [place],  [place] [address]  Wilmon Pali, FNP

## 2022-02-28 ENCOUNTER — Other Ambulatory Visit
Admission: RE | Admit: 2022-02-28 | Discharge: 2022-02-28 | Disposition: A | Discharge status: Home / Self Care | Payer: Medicare PPO | Attending: Ophthalmology | Admitting: Ophthalmology

## 2022-02-28 DIAGNOSIS — H47011 Ischemic optic neuropathy, right eye: Secondary | ICD-10-CM | POA: Insufficient documentation

## 2022-02-28 LAB — CBC WITH DIFFERENTIAL/PLATELET
Abs Immature Granulocytes: 0 10*3/uL (ref 0.00–0.07)
Basophils Absolute: 0.1 10*3/uL (ref 0.0–0.1)
Basophils Relative: 1 %
Eosinophils Absolute: 0.2 10*3/uL (ref 0.0–0.5)
Eosinophils Relative: 2 %
HCT: 38.8 % (ref 36.0–46.0)
Hemoglobin: 12.7 g/dL (ref 12.0–15.0)
Immature Granulocytes: 0 %
Lymphocytes Relative: 29 %
Lymphs Abs: 2.1 10*3/uL (ref 0.7–4.0)
MCH: 29.9 pg (ref 26.0–34.0)
MCHC: 32.7 g/dL (ref 30.0–36.0)
MCV: 91.3 fL (ref 80.0–100.0)
Monocytes Absolute: 0.6 10*3/uL (ref 0.1–1.0)
Monocytes Relative: 8 %
Neutro Abs: 4.2 10*3/uL (ref 1.7–7.7)
Neutrophils Relative %: 60 %
Platelets: 292 10*3/uL (ref 150–400)
RBC: 4.25 MIL/uL (ref 3.87–5.11)
RDW: 13.8 % (ref 11.5–15.5)
WBC: 7 10*3/uL (ref 4.0–10.5)
nRBC: 0 % (ref 0.0–0.2)

## 2022-02-28 LAB — SEDIMENTATION RATE: Sed Rate: 17 mm/hr (ref 0–30)

## 2022-02-28 LAB — C-REACTIVE PROTEIN: CRP: 1.1 mg/dL — ABNORMAL HIGH (ref <1.0)

## 2022-03-05 ENCOUNTER — Observation Stay
Admission: EM | Admit: 2022-03-05 | Discharge: 2022-03-07 | Disposition: A | Discharge status: Home / Self Care | Payer: Medicare PPO | Attending: Family Medicine | Admitting: Family Medicine

## 2022-03-05 ENCOUNTER — Emergency Department: Payer: Medicare PPO

## 2022-03-05 ENCOUNTER — Other Ambulatory Visit: Payer: Self-pay

## 2022-03-05 ENCOUNTER — Observation Stay: Payer: Medicare PPO

## 2022-03-05 DIAGNOSIS — H349 Unspecified retinal vascular occlusion: Secondary | ICD-10-CM | POA: Diagnosis not present

## 2022-03-05 DIAGNOSIS — F419 Anxiety disorder, unspecified: Secondary | ICD-10-CM | POA: Insufficient documentation

## 2022-03-05 DIAGNOSIS — E663 Overweight: Secondary | ICD-10-CM | POA: Diagnosis not present

## 2022-03-05 DIAGNOSIS — K219 Gastro-esophageal reflux disease without esophagitis: Secondary | ICD-10-CM | POA: Diagnosis not present

## 2022-03-05 DIAGNOSIS — E119 Type 2 diabetes mellitus without complications: Secondary | ICD-10-CM

## 2022-03-05 DIAGNOSIS — Z6826 Body mass index (BMI) 26.0-26.9, adult: Secondary | ICD-10-CM | POA: Diagnosis not present

## 2022-03-05 DIAGNOSIS — Z7984 Long term (current) use of oral hypoglycemic drugs: Secondary | ICD-10-CM | POA: Diagnosis not present

## 2022-03-05 DIAGNOSIS — R2990 Unspecified symptoms and signs involving the nervous system: Secondary | ICD-10-CM | POA: Diagnosis not present

## 2022-03-05 DIAGNOSIS — G250 Essential tremor: Secondary | ICD-10-CM | POA: Diagnosis present

## 2022-03-05 DIAGNOSIS — R29810 Facial weakness: Principal | ICD-10-CM | POA: Insufficient documentation

## 2022-03-05 DIAGNOSIS — F329 Major depressive disorder, single episode, unspecified: Secondary | ICD-10-CM | POA: Diagnosis present

## 2022-03-05 DIAGNOSIS — R944 Abnormal results of kidney function studies: Secondary | ICD-10-CM | POA: Insufficient documentation

## 2022-03-05 DIAGNOSIS — R29898 Other symptoms and signs involving the musculoskeletal system: Secondary | ICD-10-CM | POA: Insufficient documentation

## 2022-03-05 DIAGNOSIS — E782 Mixed hyperlipidemia: Secondary | ICD-10-CM | POA: Diagnosis not present

## 2022-03-05 DIAGNOSIS — Z7982 Long term (current) use of aspirin: Secondary | ICD-10-CM | POA: Diagnosis not present

## 2022-03-05 DIAGNOSIS — Z952 Presence of prosthetic heart valve: Secondary | ICD-10-CM | POA: Diagnosis not present

## 2022-03-05 DIAGNOSIS — R1310 Dysphagia, unspecified: Secondary | ICD-10-CM | POA: Diagnosis not present

## 2022-03-05 DIAGNOSIS — N179 Acute kidney failure, unspecified: Secondary | ICD-10-CM | POA: Diagnosis present

## 2022-03-05 DIAGNOSIS — I700 Atherosclerosis of aorta: Secondary | ICD-10-CM | POA: Diagnosis not present

## 2022-03-05 DIAGNOSIS — Z79899 Other long term (current) drug therapy: Secondary | ICD-10-CM | POA: Insufficient documentation

## 2022-03-05 DIAGNOSIS — I6782 Cerebral ischemia: Secondary | ICD-10-CM | POA: Diagnosis not present

## 2022-03-05 DIAGNOSIS — H532 Diplopia: Secondary | ICD-10-CM | POA: Insufficient documentation

## 2022-03-05 DIAGNOSIS — I083 Combined rheumatic disorders of mitral, aortic and tricuspid valves: Secondary | ICD-10-CM | POA: Diagnosis not present

## 2022-03-05 DIAGNOSIS — E1165 Type 2 diabetes mellitus with hyperglycemia: Secondary | ICD-10-CM | POA: Diagnosis not present

## 2022-03-05 DIAGNOSIS — G319 Degenerative disease of nervous system, unspecified: Secondary | ICD-10-CM | POA: Diagnosis not present

## 2022-03-05 DIAGNOSIS — Z823 Family history of stroke: Secondary | ICD-10-CM | POA: Diagnosis not present

## 2022-03-05 DIAGNOSIS — H538 Other visual disturbances: Secondary | ICD-10-CM | POA: Insufficient documentation

## 2022-03-05 DIAGNOSIS — I119 Hypertensive heart disease without heart failure: Secondary | ICD-10-CM | POA: Insufficient documentation

## 2022-03-05 DIAGNOSIS — H5461 Unqualified visual loss, right eye, normal vision left eye: Secondary | ICD-10-CM | POA: Diagnosis not present

## 2022-03-05 DIAGNOSIS — Z8719 Personal history of other diseases of the digestive system: Secondary | ICD-10-CM | POA: Insufficient documentation

## 2022-03-05 DIAGNOSIS — Z7952 Long term (current) use of systemic steroids: Secondary | ICD-10-CM | POA: Insufficient documentation

## 2022-03-05 DIAGNOSIS — I10 Essential (primary) hypertension: Secondary | ICD-10-CM | POA: Insufficient documentation

## 2022-03-05 DIAGNOSIS — Z66 Do not resuscitate: Secondary | ICD-10-CM | POA: Diagnosis not present

## 2022-03-05 DIAGNOSIS — R200 Anesthesia of skin: Secondary | ICD-10-CM | POA: Diagnosis present

## 2022-03-05 DIAGNOSIS — G510 Bell's palsy: Secondary | ICD-10-CM

## 2022-03-05 DIAGNOSIS — R29818 Other symptoms and signs involving the nervous system: Secondary | ICD-10-CM | POA: Insufficient documentation

## 2022-03-05 HISTORY — DX: Essential tremor: G25.0

## 2022-03-05 LAB — BASIC METABOLIC PANEL
Anion gap: 8 (ref 5–15)
BUN: 31 mg/dL — ABNORMAL HIGH (ref 8–23)
CO2: 26 mmol/L (ref 22–32)
Calcium: 10.2 mg/dL (ref 8.9–10.3)
Chloride: 102 mmol/L (ref 98–111)
Creatinine, Ser: 1.37 mg/dL — ABNORMAL HIGH (ref 0.44–1.00)
GFR, Estimated: 43 mL/min — ABNORMAL LOW (ref >60)
Glucose, Bld: 179 mg/dL — ABNORMAL HIGH (ref 70–99)
Potassium: 4.1 mmol/L (ref 3.5–5.1)
Sodium: 136 mmol/L (ref 135–145)

## 2022-03-05 LAB — CBC
HCT: 39.5 % (ref 36.0–46.0)
Hemoglobin: 13 g/dL (ref 12.0–15.0)
MCH: 30.4 pg (ref 26.0–34.0)
MCHC: 32.9 g/dL (ref 30.0–36.0)
MCV: 92.5 fL (ref 80.0–100.0)
Platelets: 322 10*3/uL (ref 150–400)
RBC: 4.27 MIL/uL (ref 3.87–5.11)
RDW: 13.7 % (ref 11.5–15.5)
WBC: 7.2 10*3/uL (ref 4.0–10.5)
nRBC: 0 % (ref 0.0–0.2)

## 2022-03-05 LAB — TSH: TSH: 1.247 u[IU]/mL (ref 0.350–4.500)

## 2022-03-05 LAB — URINE DRUG SCREEN, QUALITATIVE (ARMC ONLY)
Amphetamines, Ur Screen: NOT DETECTED
Barbiturates, Ur Screen: NOT DETECTED
Benzodiazepine, Ur Scrn: NOT DETECTED
Cannabinoid 50 Ng, Ur ~~LOC~~: NOT DETECTED
Cocaine Metabolite,Ur ~~LOC~~: NOT DETECTED
MDMA (Ecstasy)Ur Screen: NOT DETECTED
Methadone Scn, Ur: NOT DETECTED
Opiate, Ur Screen: NOT DETECTED
Phencyclidine (PCP) Ur S: NOT DETECTED
Tricyclic, Ur Screen: NOT DETECTED

## 2022-03-05 LAB — HEMOGLOBIN A1C
Hgb A1c MFr Bld: 7.6 % — ABNORMAL HIGH (ref 4.8–5.6)
Mean Plasma Glucose: 171.42 mg/dL

## 2022-03-05 LAB — CBG MONITORING, ED: Glucose-Capillary: 165 mg/dL — ABNORMAL HIGH (ref 70–99)

## 2022-03-05 MED ORDER — ASPIRIN 81 MG PO CHEW
324.0000 mg | CHEWABLE_TABLET | Freq: Once | ORAL | Status: AC
Start: 2022-03-05 — End: 2022-03-05
  Administered 2022-03-05: 324 mg via ORAL
  Filled 2022-03-05: qty 4

## 2022-03-05 MED ORDER — ENOXAPARIN SODIUM 40 MG/0.4ML IJ SOSY
40.0000 mg | PREFILLED_SYRINGE | Freq: Every day | INTRAMUSCULAR | Status: DC
  Administered 2022-03-05 – 2022-03-06 (×2): 40 mg via SUBCUTANEOUS
  Filled 2022-03-05 (×2): qty 0.4

## 2022-03-05 MED ORDER — LORATADINE 10 MG PO TABS
10.0000 mg | ORAL_TABLET | Freq: Every day | ORAL | Status: DC
  Administered 2022-03-06 – 2022-03-07 (×2): 10 mg via ORAL
  Filled 2022-03-05 (×2): qty 1

## 2022-03-05 MED ORDER — STROKE: EARLY STAGES OF RECOVERY BOOK: Freq: Once | Status: AC

## 2022-03-05 MED ORDER — SENNOSIDES-DOCUSATE SODIUM 8.6-50 MG PO TABS: 1.0000 | ORAL_TABLET | Freq: Every evening | ORAL | Status: DC | PRN

## 2022-03-05 MED ORDER — ACETAMINOPHEN 650 MG RE SUPP: 650.0000 mg | RECTAL | Status: DC | PRN

## 2022-03-05 MED ORDER — ACETAMINOPHEN 160 MG/5ML PO SOLN
650.0000 mg | ORAL | Status: DC | PRN
  Filled 2022-03-05: qty 20.3

## 2022-03-05 MED ORDER — ACETAMINOPHEN 325 MG PO TABS
650.0000 mg | ORAL_TABLET | ORAL | Status: DC | PRN
  Administered 2022-03-05: 650 mg via ORAL
  Filled 2022-03-05: qty 2

## 2022-03-05 MED ORDER — ATORVASTATIN CALCIUM 20 MG PO TABS
40.0000 mg | ORAL_TABLET | Freq: Every day | ORAL | Status: DC
  Administered 2022-03-05 – 2022-03-07 (×3): 40 mg via ORAL
  Filled 2022-03-05 (×3): qty 0.5
  Filled 2022-03-05: qty 2

## 2022-03-05 MED ORDER — ASPIRIN EC 81 MG PO TBEC
81.0000 mg | DELAYED_RELEASE_TABLET | Freq: Every day | ORAL | Status: DC
  Administered 2022-03-06 – 2022-03-07 (×2): 81 mg via ORAL
  Filled 2022-03-05 (×2): qty 1

## 2022-03-05 MED ORDER — SERTRALINE HCL 100 MG PO TABS
100.0000 mg | ORAL_TABLET | Freq: Every day | ORAL | Status: DC
Start: 2022-03-07 — End: 2022-03-07

## 2022-03-05 MED ORDER — CLOPIDOGREL BISULFATE 75 MG PO TABS
75.0000 mg | ORAL_TABLET | Freq: Every day | ORAL | Status: DC
  Administered 2022-03-05 – 2022-03-07 (×3): 75 mg via ORAL
  Filled 2022-03-05 (×3): qty 1

## 2022-03-05 MED ORDER — PANTOPRAZOLE SODIUM 40 MG PO TBEC
40.0000 mg | DELAYED_RELEASE_TABLET | Freq: Every day | ORAL | Status: DC
  Administered 2022-03-06 – 2022-03-07 (×2): 40 mg via ORAL
  Filled 2022-03-05 (×2): qty 1

## 2022-03-05 NOTE — ED Triage Notes (Addendum)
Pt to ED POV complains of R face feeling different than L face since 0730 today and intermittent since last 3 days. Not numb, but sensation feels different, also R eye feels different than L eye. R face feels heavy.  ? ?States that about 2 weeks ago noticed black spot in vision. 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 Pt has steady gait to triage room.  ? ?Pt has visual loss to R and L lower fields to R eye, noted on [facility] exam that is consistent with her loss of vision since 2 weeks ago. Smile is symmetric. Pt has essential tremor. ?

## 2022-03-05 NOTE — ED Provider Notes (Signed)
? ?[facility] ?Provider Note ? ? ? Event Date/Time  ? First MD Initiated Contact with Patient 03/05/22 1603   ?  (approximate) ? ? ?History  ? ?Chief Complaint ?R facial numbness ("Feels different") ? ? ?HPI ? ?Kathleen Frazier is a 66 y.o. female with past medical history of hypertension, hyperlipidemia, diabetes, and GERD who presents to the ED complaining of facial numbness.  Patient reports that she woke up this morning around 730 with sensation of the right side of her face being numb and "heavy."  She states that when she went to sleep last night it felt normal, noticed this sensation as soon as she woke up.  She has not noticed any drooping in the right side of her face and she denies any speech changes, extremity numbness or weakness.  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  She denies any history of stroke prior to this and has not been seen by a neurologist.  She does take a daily baby aspirin.  She has had blurriness in vision in her right eye that is unchanged since her diagnosis of ocular stroke last week. ?  ? ? ?Physical Exam  ? ?Triage Vital Signs: ?ED Triage Vitals  ?Enc Vitals Group  ?   BP 03/05/22 1451 (!) 135/93  ?   Pulse Rate 03/05/22 1451 96  ?   Resp 03/05/22 1451 16  ?   Temp 03/05/22 1451 98.4 ?F (36.9 ?C)  ?   Temp Source 03/05/22 1451 Oral  ?   SpO2 03/05/22 1451 98 %  ?   Weight 03/05/22 1452 145 lb (65.8 kg)  ?   Height 03/05/22 1452 5\' 2"  (1.575 m)  ?   Head Circumference --   ?   Peak Flow --   ?   Pain Score 03/05/22 1452 2  ?   Pain Loc --   ?   Pain Edu? --   ?   Excl. in GC? --   ? ? ?Most recent vital signs: ?Vitals:  ? 03/05/22 1927 03/05/22 2024  ?BP: (!) 145/97 (!) 150/90  ?Pulse: 90 93  ?Resp: 16 16  ?Temp: 98.7 ?F (37.1 ?C) 98.3 ?F (36.8 ?C)  ?SpO2: 96% 98%  ? ? ?Constitutional: Alert and oriented. ?Eyes: Conjunctivae are normal. ?Head: Atraumatic. ?Nose: No  congestion/rhinnorhea. ?Mouth/Throat: Mucous membranes are moist.  ?Cardiovascular: Normal rate, regular rhythm. Grossly normal heart sounds.  2+ radial pulses bilaterally. ?Respiratory: Normal respiratory effort.  No retractions. Lungs CTAB. ?Gastrointestinal: Soft and nontender. No distention. ?Musculoskeletal: No lower extremity tenderness nor edema.  ?Neurologic:  Normal speech and language.  Subtle right-sided facial droop noted with decreased sensation over right face.  Strength and sensation intact to bilateral upper and lower extremities. ? ? ? ?ED Results / Procedures / Treatments  ? ?Labs ?(all labs ordered are listed, but only abnormal results are displayed) ?Labs Reviewed  ?BASIC METABOLIC PANEL - Abnormal; Notable for the following components:  ?    Result Value  ? Glucose, Bld 179 (*)   ? BUN 31 (*)   ? Creatinine, Ser 1.37 (*)   ? GFR, Estimated 43 (*)   ? All other components within normal limits  ?HEMOGLOBIN A1C - Abnormal; Notable for the following components:  ? Hgb A1c MFr Bld 7.6 (*)   ? All other components within normal limits  ?CBG MONITORING, ED - Abnormal; Notable for the following components:  ?  Glucose-Capillary 165 (*)   ? All other components within normal limits  ?CBC  ?TSH  ?URINE DRUG SCREEN, QUALITATIVE ([facility] ONLY)  ?LIPID PANEL  ?HIV ANTIBODY (ROUTINE TESTING W REFLEX)  ? ? ? ?EKG ? ?ED ECG REPORT ?Harriet Masson, the attending physician, personally viewed and interpreted this ECG. ? ? Date: 03/05/2022 ? EKG Time: 14:45 ? Rate: 96 ? Rhythm: normal sinus rhythm ? Axis: Normal ? Intervals:none ? ST&T Change: None ? ?RADIOLOGY ?CT head reviewed by me with no hemorrhage or midline shift. ? ?PROCEDURES: ? ?Critical Care performed: No ? ?Procedures ? ? ?MEDICATIONS ORDERED IN ED: ?Medications  ?aspirin EC tablet 81 mg (has no administration in time range)  ?atorvastatin (LIPITOR) tablet 40 mg (40 mg Oral Given 03/05/22 2150)  ?sertraline (ZOLOFT) tablet 100 mg (has no administration in  time range)  ?pantoprazole (PROTONIX) EC tablet 40 mg (has no administration in time range)  ?loratadine (CLARITIN) tablet 10 mg (has no administration in time range)  ?enoxaparin (LOVENOX) injection 40 mg (40 mg Subcutaneous Given 03/05/22 2314)  ?acetaminophen (TYLENOL) tablet 650 mg (650 mg Oral Given 03/05/22 2312)  ?  Or  ?acetaminophen (TYLENOL) 160 MG/5ML solution 650 mg ( Per Tube See Alternative 03/05/22 2312)  ?  Or  ?acetaminophen (TYLENOL) suppository 650 mg ( Rectal See Alternative 03/05/22 2312)  ?senna-docusate (Senokot-S) tablet 1 tablet (has no administration in time range)  ?clopidogrel (PLAVIX) tablet 75 mg (75 mg Oral Given 03/05/22 2032)  ?aspirin chewable tablet 324 mg (324 mg Oral Given 03/05/22 1809)  ? stroke: early stages of recovery book ( Does not apply Given 03/05/22 2030)  ? ? ? ?IMPRESSION / MDM / ASSESSMENT AND PLAN / ED COURSE  ?I reviewed the triage vital signs and the nursing notes. ?             ?               ? ?66 y.o. female with past medical history of hypertension, hyperlipidemia, diabetes, and GERD who presents to the ED complaining of decreased sensation over the right side of her face with sensation of heaviness since waking up this morning at 7:30 AM. ? ?Differential diagnosis includes, but is not limited to, stroke, TIA, Bell's palsy, electrolyte abnormality. ? ?Patient nontoxic-appearing and in no acute distress, vital signs are unremarkable.  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  CT is unremarkable and we will further assess with MRI of her brain.  There is no involvement of her forehead to suggest Bell's palsy.  Labs are remarkable for mildly elevated creatinine with no electrolyte abnormality, CBC shows no anemia or leukocytosis.  Case discussed with hospitalist for admission for further stroke work-up. ? ?  ? ? ?FINAL CLINICAL  IMPRESSION(S) / ED DIAGNOSES  ? ?Final diagnoses:  ?Facial droop  ? ? ? ?Rx / DC Orders  ? ?ED Discharge Orders   ? ? None  ? ?  ? ? ? ?Note:  This document was prepared using Dragon voice recognition software and may include unintentional dictation errors. ?  ?Chesley Noon, MD ?03/05/22 2349 ? ?

## 2022-03-05 NOTE — H&P (Signed)
?History and Physical  ? ? ?Patient: Kathleen Frazier [id number] DOB: October 21, 1956 ?DOA: 03/05/2022 ?DOS: the patient was seen and examined on 03/05/2022 ?PCP: Coolidge Breeze, FNP  ?Patient coming from: Home - lives alone; NOK: Kathleen, Frazier, [phone] ? ? ?Chief Complaint: Stroke-like symptoms ? ?HPI: Kathleen Frazier is a 66 y.o. female with medical history significant of anxiety/depression; HTN; and HLD presenting with stroke-like symptoms.  She reports that last week she was seen by her ophthalmologist and diagnosed with an "optic nerve stroke."  She had bloodwork drawn and was told that he would let her know if anything was abnormal but he didn't call.  He also told her she is at increased risk for another stroke.  She awoke this AM and noticed R facial fullness and numbness.  She has mild chronic dysphagia and no new issues.  No other neurologic symptoms. ? ? ? ?ER Course:  Needs stroke work-up.  Woke up this AM with R face heaviness, has subtle droop and decreased face sensation.  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  CT negative.  MRI pending. ? ? ? ? ?Review of Systems: As mentioned in the history of present illness. All other systems reviewed and are negative. ?Past Medical History:  ?Diagnosis Date  ? Anxiety   ? Depression   ? GERD (gastroesophageal reflux disease)   ? H/O hiatal hernia   ? Hyperlipemia   ? Hypertension   ? Osteoporosis   ? Seasonal allergies   ? Tremor, essential   ? ?Past Surgical History:  ?Procedure Laterality Date  ? BREAST CYST ASPIRATION  30+ years ago  ? CARDIAC VALVE REPLACEMENT    ? mitral valve replacement  ? COLONOSCOPY N/A 12/28/2013  ? Procedure: COLONOSCOPY;  Surgeon: Danie Binder, MD;  Location: [facility];  Service: Endoscopy;  Laterality: N/A;  10:45 AM  ? right leg has rod and screws    ? ?Social History:  reports that she has never smoked. She does not have any smokeless tobacco history on file. She reports that she does not currently use  alcohol. She reports that she does not use drugs. ? ?No Known Allergies ? ?Family History  ?Problem Relation Age of Onset  ? Stroke Mother 54  ?     brain stem stroke  ? Breast cancer Neg Hx   ? ? ?Prior to Admission medications   ?Medication Sig Start Date End Date Taking? Authorizing Provider  ?aspirin 81 MG tablet Take 81 mg by mouth daily.    [provider]  ?benazepril (LOTENSIN) 40 MG tablet Take 40 mg by mouth daily.    [provider]  ?loratadine (CLARITIN) 10 MG tablet Take 10 mg by mouth daily.    [provider]  ?Multiple Vitamin (MULTIVITAMIN) tablet Take 1 tablet by mouth daily.    [provider]  ?NON FORMULARY Calcium 600 + D    BID    [provider]  ?Omega-3 Fatty Acids (FISH OIL) 1000 MG CPDR Take by mouth 2 (two) times daily.    [provider]  ?pantoprazole (PROTONIX) 40 MG tablet Take 40 mg by mouth daily.    [provider]  ?pindolol (VISKEN) 5 MG tablet Take 5 mg by mouth daily.     [provider]  ?propranolol (INDERAL) 20 MG tablet Take 1 tablet (20 mg total) by mouth 2 (two) times daily as needed. 01/08/22   Marcial Pacas, MD  ?Sertraline HCl 150 MG  CAPS Take 100 mg by mouth daily.    [provider]  ?simvastatin (ZOCOR) 40 MG tablet Take 40 mg by mouth every other day.     [provider]  ? ? ?Physical Exam: ?Vitals:  ? 03/05/22 1451 03/05/22 1452 03/05/22 1636  ?BP: (!) 135/93  (!) 134/92  ?Pulse: 96  91  ?Resp: 16  13  ?Temp: 98.4 ?F (36.9 ?C)    ?TempSrc: Oral    ?SpO2: 98%  100%  ?Weight:  65.8 kg   ?Height:  5\' 2"  (1.575 m)   ? ?General:  Appears calm and comfortable and is in NAD ?Eyes:  EOMI, normal lids, iris ?ENT:  grossly normal hearing, lips & tongue, mmm; appropriate dentition ?Neck:  no LAD, masses or thyromegaly ?Cardiovascular:  RRR, [id number] systolic murmur, no r/g. No LE edema.  ?Respiratory:   CTA bilaterally with no wheezes/rales/rhonchi.  Normal respiratory effort. ?Abdomen:   soft, NT, ND ?Skin:  no rash or induration seen on limited exam ?Musculoskeletal:  grossly normal tone BUE/BLE, good ROM, no bony abnormality ?Psychiatric:  blunted mood and affect, speech fluent and appropriate, AOx3 ?Neurologic:  CN 2-12 grossly intact other than very subtle possible R nasolacrimal flattening, moves all extremities in coordinated fashion, sensation intact other than R forehead diminished ? ? ?Radiological Exams on Admission: ?Independently reviewed - see discussion in A/P where applicable ? ?CT HEAD WO CONTRAST ? ?Result Date: 03/05/2022 ?CLINICAL DATA:  Transient ischemic attack (TIA) EXAM: CT HEAD WITHOUT CONTRAST TECHNIQUE: Contiguous axial images were obtained from the base of the skull through the vertex without intravenous contrast. RADIATION DOSE REDUCTION: This exam was performed according to the departmental dose-optimization program which includes automated exposure control, adjustment of the mA and/or kV according to patient size and/or use of iterative reconstruction technique. COMPARISON:  None. FINDINGS: Brain: No evidence of acute large vascular territory infarction, hemorrhage, hydrocephalus, extra-axial collection or mass lesion/mass effect. Patchy white matter hypodensities, nonspecific but compatible with chronic microvascular ischemic disease. Mild cerebral atrophy. Vascular: No hyperdense vessel identified. Skull: No acute fracture. Sinuses/Orbits: Visualized sinuses are clear. No acute orbital findings. Other: No mastoid effusions. IMPRESSION: 1. No evidence of acute intracranial abnormality. 2. 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 Electronically Signed   By: Margaretha Sheffield M.D.   On: 03/05/2022 15:37   ? ?EKG: Independently reviewed.  NSR with rate 96; no evidence of acute ischemia ? ? ?Labs on Admission: I have personally reviewed the available labs and imaging studies at the time of the admission. ? ?Pertinent labs:   ? ?Glucose 179 ?BUN  31/Creatinine 1.37/GFR 43 ?Normal CBC ? ? ?Assessment and Plan: ?Principal Problem: ?  Stroke-like symptoms ?Active Problems: ?  Diabetes mellitus ([place]) ?  Essential hypertension ?  Major depressive disorder, single episode, unspecified ?  Mixed hyperlipidemia ?  Essential tremor ?  DNR (do not resuscitate) ? 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 ?-Echo ?-Risk stratification with FLP, A1c; will also check TSH and UDS ?-Patient will need DAPT for 21 days when ABCD2 score is at least 4 and NIH score is 3 or less, and then  can transition to monotherapy with a single antiplatelet agent.  Since this patient has failed primary prevention with ASA, transition to Plavix appears to be reasonable.  Will defer to neurology for now. ?-Neurology consult ?-PT/OT/ST/Nutrition Consults ? ?HTN ?-Allow permissive HTN for now ?-Treat BP only if >220/120, and then with goal of 15% reduction ?-Hold benazepril, pindolol, propranolol and plan to restart in 48-72 hours ?  ?HLD ?-Check FLP ?-Resume statin but will change Zocor to Lipitor 40 mg daily ?-Hold fish oil for now due to limited inpatient utility ?  ?DM ?-Check A1c ?-Hold home PO metformin ?-Will order moderate-scale SSI ? ?Depression ?-Continue sertraline ? ?Essential tremor  ?-Takes propranolol prn tremor ?-Maybe consider changing/stopping pindolol if she is taking this frequently ? ?DNR ?-I have discussed code status with the patient and her son and  they are in agreement that the patient would not desire resuscitation and would  prefer to die a natural death should that situation arise. ?-She will need a gold out of facility DNR form at the time of discharge  ? ? ?Advance Care Planning:   Code Status: DNR  ? ?Consults: Neurology; PT/OT/S

## 2022-03-06 ENCOUNTER — Observation Stay
Admit: 2022-03-06 | Discharge: 2022-03-06 | Disposition: A | Discharge status: Home / Self Care | Payer: Medicare PPO | Attending: Internal Medicine | Admitting: Internal Medicine

## 2022-03-06 ENCOUNTER — Observation Stay: Admit: 2022-03-06 | Payer: Medicare PPO

## 2022-03-06 ENCOUNTER — Observation Stay: Payer: Medicare PPO

## 2022-03-06 DIAGNOSIS — G510 Bell's palsy: Secondary | ICD-10-CM

## 2022-03-06 DIAGNOSIS — I10 Essential (primary) hypertension: Secondary | ICD-10-CM | POA: Diagnosis not present

## 2022-03-06 DIAGNOSIS — N179 Acute kidney failure, unspecified: Secondary | ICD-10-CM | POA: Diagnosis not present

## 2022-03-06 DIAGNOSIS — R2990 Unspecified symptoms and signs involving the nervous system: Secondary | ICD-10-CM | POA: Diagnosis not present

## 2022-03-06 DIAGNOSIS — E663 Overweight: Secondary | ICD-10-CM | POA: Diagnosis present

## 2022-03-06 LAB — ECHOCARDIOGRAM COMPLETE
AR max vel: 2.2 cm2
AV Area VTI: 1.88 cm2
AV Area mean vel: 1.99 cm2
AV Mean grad: 4 mmHg
AV Peak grad: 7.1 mmHg
Ao pk vel: 1.33 m/s
Area-P 1/2: 5.16 cm2
Height: 62 in
MV VTI: 1.34 cm2
P 1/2 time: 485 msec
S' Lateral: 2.16 cm
Weight: 2320 oz

## 2022-03-06 LAB — LIPID PANEL
Cholesterol: 165 mg/dL (ref 0–200)
HDL: 54 mg/dL (ref >40)
LDL Cholesterol: 72 mg/dL (ref 0–99)
Total CHOL/HDL Ratio: 3.1 RATIO
Triglycerides: 193 mg/dL — ABNORMAL HIGH (ref <150)
VLDL: 39 mg/dL (ref 0–40)

## 2022-03-06 LAB — HIV ANTIBODY (ROUTINE TESTING W REFLEX): HIV Screen 4th Generation wRfx: NONREACTIVE

## 2022-03-06 MED ORDER — ADULT MULTIVITAMIN W/MINERALS CH
1.0000 | ORAL_TABLET | Freq: Every day | ORAL | Status: DC
  Administered 2022-03-06 – 2022-03-07 (×2): 1 via ORAL
  Filled 2022-03-06 (×2): qty 1

## 2022-03-06 MED ORDER — GADOBUTROL 1 MMOL/ML IV SOLN
6.0000 mL | Freq: Once | INTRAVENOUS | Status: AC | PRN
  Administered 2022-03-06: 6 mL via INTRAVENOUS

## 2022-03-06 MED ORDER — SODIUM CHLORIDE 0.9 % IV SOLN: INTRAVENOUS | Status: DC

## 2022-03-06 NOTE — Assessment & Plan Note (Signed)
Continue Zoloft 

## 2022-03-06 NOTE — Assessment & Plan Note (Signed)
Blood pressure stable ? ?

## 2022-03-06 NOTE — Assessment & Plan Note (Addendum)
Does not appear to be CVA or TIA.  MRI and CT unremarkable.  Echo and Dopplers unremarkable.  Discussed with neurology.  Given forehead involvement, could be Bell's palsy?  MRI of the orbits unremarkable.  Patient is cleared from neurology to be discharged on prednisone and Valtrex for 7 days for Bell's palsy. ?

## 2022-03-06 NOTE — Progress Notes (Signed)
[facility] Progress Note ? ?Patient: Kathleen Frazier    [id number]  DOA: 03/05/2022    ?Date of Service: the patient was seen and examined on 03/06/2022 ? ?Brief hospital course: ?66 year old female with past medical history of anxiety/depression, hypertension, diabetes mellitus and hyperlipidemia who presented to the emergency room on 4/24 with complaints of right facial fullness and numbness that started earlier that morning.  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  Patient brought in for further evaluation and stroke work-up.  Neurology consulted. ? ?Assessment and Plan: ?Assessment and Plan: ?* Stroke-like symptoms ?Does not appear to be CVA or TIA.  MRI and CT unremarkable.  Echo and Dopplers unremarkable.  Discussed with neurology.  Given forehead involvement, could be Bell's palsy?  MRI of the orbits ordered as per neurology.  Results are pending.  Neurology will follow-up in the morning and if negative, plan for discharge. ? ?Mixed hyperlipidemia ?Continue statin.  LDL near goal at 72. ? ?Essential hypertension ?Blood pressure stable ? ?AKI (acute kidney injury) ([place]) ?Creatinine on admission at 1.37 with GFR 43 and BUN of 31.  We do not have previous labs prior to this hospitalization to compare.  Unclear if this is acute versus stage IIIb chronic kidney disease.  We will try some gentle fluids and recheck labs in the morning. ? ?Major depressive disorder, single episode, unspecified ?Continue Zoloft ? ?Uncontrolled type 2 diabetes mellitus with hyperglycemia, without long-term current use of insulin ([place]) ?A1c at 7.6.  Patient on metformin 500 daily.  Will recommend increase to 1000 twice daily for better control. ? ?Overweight (BMI 25.0-29.9) ?Patient meets criteria with BMI greater than 25 ? ? ? ? ? ? ?Body mass index is 26.52 kg/m?Marland Kitchen  ?Nutrition Problem: Increased nutrient needs ?Etiology: acute  illness ?   ? ?Consultants: ?Neurology ? ?Procedures: ?Echocardiogram done 4/25: Preserved ejection fraction.  Mild to moderate mitral, tricuspid and aortic regurg.  Grade 1 diastolic dysfunction. ? ?Antimicrobials: ?None ? ?Code Status: Full code ? ? ?Subjective: Patient doing okay with some decrease in the fullness feeling although she still feels it present ? ?Objective: ?Vital signs were reviewed and unremarkable. ?Vitals:  ? 03/06/22 1252 03/06/22 1600  ?BP: 123/90 122/89  ?Pulse: 94 95  ?Resp: 16 19  ?Temp: 97.9 ?F (36.6 ?C) 98.1 ?F (36.7 ?C)  ?SpO2: 100% 99%  ? ? ?Intake/Output Summary (Last 24 hours) at 03/06/2022 1828 ?Last data filed at 03/06/2022 1300 ?Gross per 24 hour  ?Intake 240 ml  ?Output --  ?Net 240 ml  ? ?Filed Weights  ? 03/05/22 1452  ?Weight: 65.8 kg  ? ?Body mass index is 26.52 kg/m?. ? ?Exam: ? ?General: Alert and oriented x3, no acute distress ?HEENT: Normocephalic, atraumatic, mucous membranes are moist ?Cardiovascular: Regular rate and rhythm, S1-S2 ?Respiratory: Clear to auscultation bilaterally ?Abdomen: Soft, nontender, nondistended, positive bowel sounds ?Musculoskeletal: No clubbing or cyanosis or edema ?Skin: No skin breaks, tears or lesions ?Psychiatry: Appropriate, no evidence of psychoses ?Neurology: No focal deficits.  Facial droop not appreciated ? ?Data Reviewed: ?There are no new results to review at this time. ? ?Disposition:  ?Status is: Observation ? ?  ? ?Anticipated discharge date: 4/26 ? ?Remaining issues to be resolved so that patient can be discharged: Follow-up by neurology ? ?DVT Prophylaxis: ?enoxaparin (LOVENOX) injection 40 mg Start: 03/05/22 2200 ? ? ? ?Author: ?Annita Brod ,MD ?03/06/2022 6:28 PM ? ?To  reach On-call, see care teams to locate the attending and reach out via [email or web address]. ?Between 7PM-7AM, please contact night-coverage ?If you still have difficulty reaching the attending provider, please page the [facility] (Director on Call) for [facility]  on amion for assistance. ? ?

## 2022-03-06 NOTE — Progress Notes (Signed)
SLP Cancellation Note ? ?Patient Details ?Name: Kathleen Frazier ?MRN: [id number] ?DOB: 12/24/1955 ? ? ?Cancelled treatment:       Reason Eval/Treat Not Completed: SLP screened, no needs identified, will sign off ? ?Kathleen Frazier, M.S., Kathleen Frazier, Kathleen Frazier ?Speech-Language Pathologist ?Rehabilitation Services ?Office [phone] ? ?Kathleen Frazier ?03/06/2022, 4:19 PM ?

## 2022-03-06 NOTE — Evaluation (Signed)
Physical Therapy Evaluation ?Patient Details ?Name: Kathleen Frazier ?MRN: [id number] ?DOB: 03-13-1956 ?Today's Date: 03/06/2022 ? ?History of Present Illness ? Pt is a 66 yo female that presented to ED for R face heaviness and decreased sensation. PMH of anxiety/depression, HTN, HLD, hiatal hernia,tremor.  Also recently told by her ophthalmologist and diagnosed with an "optic nerve stroke." ?  ?Clinical Impression ? Pt alert, agreeable to PT, denied pain. At baseline the patient is independent, lives alone and has family that lives in the area. No falls in the last 6 months. ? ?Upon assessment the patient demonstrated BUE and BLE strength, coordination (gross and fine motor), and sensation WFLs and symmetrical. All mobility performed independently, and DGI score of 21 indicated low fall risk. Pt's main complaint is a change in vision and facial sensation. Did have decreased peripheral fields, saccades and tracking WFLs, as well as some decreased motor function in regards to her facial nerve. Recommendation at this time is no PT follow up due to the patient being at a functional mobility baseline, but did speak with pt about following up with neurologist and an ophthalmologist as able.    ?   ? ?Recommendations for follow up therapy are one component of a multi-disciplinary discharge planning process, led by the attending physician.  Recommendations may be updated based on patient status, additional functional criteria and insurance authorization. ? ?Follow Up Recommendations No PT follow up ? ?  ?Assistance Recommended at Discharge PRN  ?Patient can return home with the following ?   ? ?  ?Equipment Recommendations None recommended by PT  ?Recommendations for Other Services ?    ?  ?Functional Status Assessment Patient has not had a recent decline in their functional status  ? ?  ?Precautions / Restrictions Precautions ?Precautions: Fall ?Restrictions ?Weight Bearing Restrictions: No  ? ?  ? ?Mobility ? Bed  Mobility ?Overal bed mobility: Independent ?  ?  ?  ?  ?  ?  ?  ?  ? ?Transfers ?Overall transfer level: Independent ?  ?  ?  ?  ?  ?  ?  ?  ?  ?  ? ?Ambulation/Gait ?Ambulation/Gait assistance: Independent, Supervision ?Gait Distance (Feet): 160 Feet ?Assistive device: None ?  ?  ?  ?  ?General Gait Details: able to ambulate independently, supervision/CGA provided for balance assessment ? ?Stairs ?  ?  ?  ?  ?  ? ?Wheelchair Mobility ?  ? ?Modified Rankin (Stroke Patients Only) ?  ? ?  ? ?Balance Overall balance assessment: Independent ?  ?  ?  ?  ?  ?  ?  ?  ?  ?  ?  ?  ?  ?  ?  ?Standardized Balance Assessment ?Standardized Balance Assessment : Dynamic Gait Index ?  ?Dynamic Gait Index ?Level Surface: Normal ?Change in Gait Speed: Normal ?Gait with Horizontal Head Turns: Mild Impairment ?Gait with Vertical Head Turns: Mild Impairment ?Gait and Pivot Turn: Normal ?Step Over Obstacle: Normal ?Step Around Obstacles: Normal ?Steps: Mild Impairment (clinical judgement) ?Total Score: 21 ?   ? ? ? ?Pertinent Vitals/Pain Pain Assessment ?Pain Assessment: No/denies pain  ? ? ?Home Living Family/patient expects to be discharged to:: Private residence ?Living Arrangements: Alone ?Available Help at Discharge: Family;Available PRN/intermittently ?Type of Home: House ?Home Access: Ramped entrance ?  ?  ?  ?Home Layout: One level;Able to live on main level with bedroom/bathroom ?Home Equipment: None ?   ?  ?Prior Function Prior Level of Function :  Independent/Modified Independent ?  ?  ?  ?  ?  ?  ?  ?  ?  ? ? ?Hand Dominance  ? Dominant Hand: Right ? ?  ?Extremity/Trunk Assessment  ? Upper Extremity Assessment ?Upper Extremity Assessment: Overall WFL for tasks assessed ?  ? ?Lower Extremity Assessment ?Lower Extremity Assessment: Overall WFL for tasks assessed ?  ? ?Cervical / Trunk Assessment ?Cervical / Trunk Assessment: Normal  ?Communication  ? Communication: No difficulties  ?Cognition Arousal/Alertness:  Awake/alert ?Behavior During Therapy: [facility] for tasks assessed/performed ?Overall Cognitive Status: Within Functional Limits for tasks assessed ?  ?  ?  ?  ?  ?  ?  ?  ?  ?  ?  ?  ?  ?  ?  ?  ?  ?  ?  ? ?  ?General Comments   ? ?  ?Exercises    ? ?Assessment/Plan  ?  ?PT Assessment Patient does not need any further PT services  ?PT Problem List Decreased balance ? ?   ?  ?PT Treatment Interventions     ? ?PT Goals (Current goals can be found in the Care Plan section)  ?  ? ?  ?Frequency   ?  ? ? ?Co-evaluation   ?  ?  ?  ?  ? ? ?  ?AM-PAC PT "6 Clicks" Mobility  ?Outcome Measure Help needed turning from your back to your side while in a flat bed without using bedrails?: None ?Help needed moving from lying on your back to sitting on the side of a flat bed without using bedrails?: None ?Help needed moving to and from a bed to a chair (including a wheelchair)?: None ?Help needed standing up from a chair using your arms (e.g., wheelchair or bedside chair)?: None ?Help needed to walk in hospital room?: None ?Help needed climbing 3-5 steps with a railing? : None ?6 Click Score: 24 ? ?  ?End of Session Equipment Utilized During Treatment: Gait belt ?Activity Tolerance: Patient tolerated treatment well ?Patient left: in bed;with call bell/phone within reach ?Nurse Communication: Mobility status ?PT Visit Diagnosis: Other symptoms and signs involving the nervous system (R29.898) ?  ? ?Time: [id number] ?PT Time Calculation (min) (ACUTE ONLY): 18 min ? ? ?Charges:   PT Evaluation ?$PT Eval Low Complexity: 1 Low ?  ?  ?   ? ?Lieutenant Diego PT, DPT ?11:21 AM,03/06/22 ? ? ?

## 2022-03-06 NOTE — Assessment & Plan Note (Addendum)
HbA1c at 7.6.   ?Patient takes metformin 500 daily.  Would recommend increase to 1000 twice daily for better control. ?

## 2022-03-06 NOTE — Plan of Care (Signed)
  Problem: Education: Goal: Knowledge of General Education information will improve Description: Including pain rating scale, medication(s)/side effects and non-pharmacologic comfort measures Outcome: Progressing   Problem: Coping: Goal: Level of anxiety will decrease Outcome: Progressing   Problem: Safety: Goal: Ability to remain free from injury will improve Outcome: Progressing   

## 2022-03-06 NOTE — Progress Notes (Signed)
OT Cancellation Note ? ?Patient Details ?Name: Kathleen Frazier ?MRN: [id number] ?DOB: 22-Aug-1956 ? ? ?Cancelled Treatment:    Reason Eval/Treat Not Completed: OT screened, no needs identified, will sign off. Order received, chart reviewed. Pt back to baseline functional independence. No skilled OT needs identified. Pt educated on d/c rec of Outpatient OT if visual deficits impair IADLs upon return home. Will sign off. Please re-consult if additional needs arise.  ? ?[name], M.S. OTR/L  ?03/06/22, 10:51 AM  ?ascom [phone] ?

## 2022-03-06 NOTE — Assessment & Plan Note (Signed)
Patient meets criteria with BMI greater than 25 

## 2022-03-06 NOTE — Assessment & Plan Note (Signed)
Continue statin.  LDL near goal at 72. ?

## 2022-03-06 NOTE — Hospital Course (Addendum)
This 66 year old female with past medical history of anxiety / depression, hypertension, diabetes mellitus and hyperlipidemia who presented to the emergency room on 4/24 with complaints of right facial fullness and numbness that started earlier that morning.  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  Patient was admitted for further evaluation and stroke work-up.  Neurology consulted.  Patient underwent stroke work-up with MRI, orbital MRI which were unremarkable.  ENT was consulted, suggested high-dose steroids but requires ophthalmology evaluation given optic nerve stroke.  Neurology think the symptoms are likely secondary to mild Bell's palsy.  Recommended prednisone 60 mg daily for 7 days and Valtrex 1 g 3 times daily for 7 days as recommended for Bell's palsy.  Patient is cleared to be discharged and patient will follow up with ophthalmology and ENT in 1 to 2 days.  Patient is being discharged home. ?

## 2022-03-06 NOTE — Care Management (Signed)
?  Transition of Care (TOC) Screening Note ? ? ?Patient Details  ?Name: Kathleen Frazier ?Date of Birth: October 24, 1956 ? ? ?Transition of Care (TOC) CM/SW Contact:    ?Caryn Section, RN ?Phone Number: ?03/06/2022, 3:07 PM ? ? ? ?Transition of Care Department [facility]) has reviewed patient and no TOC needs have been identified at this time. We will continue to monitor patient advancement through interdisciplinary progression rounds. If new patient transition needs arise, please place a TOC consult. ?  ?

## 2022-03-06 NOTE — Progress Notes (Signed)
Initial Nutrition Assessment ? ?DOCUMENTATION CODES:  ? ?Not applicable ? ?INTERVENTION:  ?- Liberalize diet from a heart healthy/carb modified to a regular diet to provide widest variety of menu options to enhance nutritional adequacy ? ?- MVI with minerals daily ? ?NUTRITION DIAGNOSIS:  ? ?Increased nutrient needs related to acute illness as evidenced by estimated needs. ? ?GOAL:  ? ?Patient will meet greater than or equal to 90% of their needs ? ?MONITOR:  ? ?PO intake, Supplement acceptance, Diet advancement, Labs, Weight trends ? ?REASON FOR ASSESSMENT:  ? ?Consult ?Assessment of nutrition requirement/status ? ?ASSESSMENT:  ? ?Pt diagnosed 1 week ago by ophthalmologist with "optic nerve stroke" and awoke 4/24 with R facial fullness and numbness subsequently being admitted with stroke-like symptoms for additional work-up. PMH significant for anxiety/depression, HTN, HLD and T2DM. ? ?Spoke with pt at bedside. She reports no changes to her appetite or PO intake recently and is eating at her baseline. She typically eats 2 meals per day and a snack midday. This morning for breakfast she had yogurt, fruit and 2/3 of a chicken biscuit from [employer]. At home she takes a MVI, Vitamin D and Calcium. Will add MVI during admission. Pt declined nutrition supplements at this time. She manages her diabetes at home with balanced meals and metformin.  ? ?Pt would benefit from a liberalized diet to provided widest variety of menu items for nutritional intake.  ? ?Pt reports a usual weight of 140 lbs and denies recent weight loss. ?Reviewed weight history. There is limited recent documentation of weights. Noted pt with a -0.9 kg loss within the last 2 months which is not significant for time frame.  ? ?Medications reviewed ? ?Labs: BUN 31, Cr 1.37, TG's 193, HgbA1c 7.6%, CBG's 165 ? ?NUTRITION - FOCUSED PHYSICAL EXAM: ? ?Flowsheet Row Most Recent Value  ?Orbital Region No depletion  ?Upper Arm Region No depletion  ?Thoracic  and Lumbar Region No depletion  ?Buccal Region No depletion  ?Temple Region No depletion  ?Clavicle Bone Region No depletion  ?Clavicle and Acromion Bone Region No depletion  ?Scapular Bone Region No depletion  ?Dorsal Hand No depletion  ?Patellar Region Mild depletion  ?Anterior Thigh Region No depletion  ?Posterior Calf Region No depletion  ?Edema (RD Assessment) None  ?Hair Reviewed  ?Eyes Reviewed  ?Mouth Reviewed  ?Skin Reviewed  ?Nails Reviewed  ? ?  ? ?Diet Order:   ?Diet Order   ? ?       ?  Diet regular Room service appropriate? Yes; Fluid consistency: Thin  Diet effective now       ?  ? ?  ?  ? ?  ? ? ?EDUCATION NEEDS:  ? ?Education needs have been addressed ? ?Skin:  Skin Assessment: Reviewed RN Assessment ? ?Last BM:  4/24 ? ?Height:  ? ?Ht Readings from Last 1 Encounters:  ?03/05/22 5\' 2"  (1.575 m)  ? ? ?Weight:  ? ?Wt Readings from Last 1 Encounters:  ?03/05/22 65.8 kg  ? ?BMI:  Body mass index is 26.52 kg/m?. ? ?Estimated Nutritional Needs:  ? ?Kcal:  1500-1700 ? ?Protein:  75-90g ? ?Fluid:  >/=1.5L ? ?Clayborne Dana, RDN, LDN ?Clinical Nutrition ?

## 2022-03-06 NOTE — Assessment & Plan Note (Addendum)
Creatinine on admission 1.37 with GFR 43 and BUN of 31. ?No prior labs to compare.  Unclear if it is acute versus stage IIIb chronic kidney disease . ?Continue IV hydration,  renal functions improved. ?

## 2022-03-07 DIAGNOSIS — R2990 Unspecified symptoms and signs involving the nervous system: Secondary | ICD-10-CM | POA: Diagnosis not present

## 2022-03-07 LAB — BASIC METABOLIC PANEL
Anion gap: 9 (ref 5–15)
BUN: 24 mg/dL — ABNORMAL HIGH (ref 8–23)
CO2: 26 mmol/L (ref 22–32)
Calcium: 9.6 mg/dL (ref 8.9–10.3)
Chloride: 104 mmol/L (ref 98–111)
Creatinine, Ser: 1.02 mg/dL — ABNORMAL HIGH (ref 0.44–1.00)
GFR, Estimated: >60 mL/min (ref >60)
Glucose, Bld: 138 mg/dL — ABNORMAL HIGH (ref 70–99)
Potassium: 4 mmol/L (ref 3.5–5.1)
Sodium: 139 mmol/L (ref 135–145)

## 2022-03-07 MED ORDER — PREDNISONE 20 MG PO TABS: 60.0000 mg | ORAL_TABLET | Freq: Every day | ORAL | 0 refills | Status: DC

## 2022-03-07 MED ORDER — ATORVASTATIN CALCIUM 40 MG PO TABS: 40.0000 mg | ORAL_TABLET | Freq: Every day | ORAL | 1 refills | Status: DC

## 2022-03-07 MED ORDER — CLOPIDOGREL BISULFATE 75 MG PO TABS: 75.0000 mg | ORAL_TABLET | Freq: Every day | ORAL | 0 refills | Status: DC

## 2022-03-07 MED ORDER — VALACYCLOVIR HCL 1 G PO TABS
1000.0000 mg | ORAL_TABLET | Freq: Three times a day (TID) | ORAL | 0 refills | Status: AC
Start: 2022-03-07 — End: 2022-03-14

## 2022-03-07 NOTE — Discharge Instructions (Addendum)
Advised to follow-up with primary care physician in 1 week. ?Advised to take aspirin 80 mg daily for stroke prevention ?Advised to take Lipitor 40 mg daily for stroke prevention ?Advised to take prednisone 60 mg daily for 7 days for Bell's palsy ?Advised to take Valtrex 1 g 3 times daily for 7 days for Bell's palsy. ?Advised to follow-up with ENT and ophthalmology as scheduled. ?

## 2022-03-07 NOTE — Progress Notes (Signed)
Mobility Specialist - Progress Note ? ? ? 03/07/22 1100  ?Mobility  ?Activity Ambulated independently in hallway  ?Level of Assistance Independent  ?Assistive Device None  ?Distance Ambulated (ft) 600 ft  ?Activity Response Tolerated well  ?$Mobility charge 1 Mobility  ? ? ?Pt ambulates in hallway indep --- voices feeling well and "ready to return home." Pt returns to room with needs in reach and family at bedside. ? ?Clarisa Schools ?Mobility Specialist ?03/07/22, 11:40 AM ? ? ? ? ?

## 2022-03-07 NOTE — Discharge Summary (Signed)
?Physician Discharge Summary ?  ?Patient: Kathleen Frazier MRN: [id number] DOB: [birth year]  ?Admit date:     03/05/2022  ?Discharge date: 03/07/22  ?Discharge Physician: Shawna Clamp  ? ?PCP: Coolidge Breeze, FNP  ? ?Recommendations at discharge:  ?Advised to follow-up with primary care physician in 1 week. ?Advised to take aspirin daily. ?Advised to take Lipitor 40 mg daily for stroke prevention ?Advised to take prednisone 60 mg daily for 7 days for Bell's palsy ?Advised to take Valtrex 1 g 3 times daily for 7 days for Bell's palsy. ?Advised to follow-up with ENT and ophthalmology as scheduled. ? ? ?Discharge Diagnoses: ?Principal Problem: ?  Stroke-like symptoms ?Active Problems: ?  Mixed hyperlipidemia ?  Essential hypertension ?  AKI (acute kidney injury) ([place]) ?  Essential tremor ?  Major depressive disorder, single episode, unspecified ?  Uncontrolled type 2 diabetes mellitus with hyperglycemia, without long-term current use of insulin ([place]) ?  Overweight (BMI 25.0-29.9) ?  DNR (do not resuscitate) ? ?Resolved Problems: ?  * No resolved hospital problems. * ? ?Hospital Course: ?This 66 year old female with past medical history of anxiety / depression, hypertension, diabetes mellitus and hyperlipidemia who presented to the emergency room on 4/24 with complaints of right facial fullness and numbness that started earlier that morning.  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  Patient was admitted for further evaluation and stroke work-up.  Neurology consulted.  Patient underwent stroke work-up with MRI, orbital MRI which were unremarkable.  ENT was consulted, suggested high-dose steroids but requires ophthalmology evaluation given optic nerve stroke.  Neurology think the symptoms are likely secondary to mild Bell's palsy.  Recommended prednisone 60 mg daily for 7 days and Valtrex 1 g 3 times daily for 7 days as recommended  for Bell's palsy.  Patient is cleared to be discharged and patient will follow up with ophthalmology and ENT in 1 to 2 days.  Patient is being discharged home. ? ?Assessment and Plan: ?* Stroke-like symptoms ?Does not appear to be CVA or TIA.  MRI and CT unremarkable.  Echo and Dopplers unremarkable.  Discussed with neurology.  Given forehead involvement, could be Bell's palsy?  MRI of the orbits unremarkable.  Patient is cleared from neurology to be discharged on prednisone and Valtrex for 7 days for Bell's palsy. ? ?Mixed hyperlipidemia ?Continue statin.  LDL near goal at 72. ? ?Essential hypertension ?Blood pressure stable ? ?AKI (acute kidney injury) ([place]) ?Creatinine on admission 1.37 with GFR 43 and BUN of 31. ?No prior labs to compare.  Unclear if it is acute versus stage IIIb chronic kidney disease . ?Continue IV hydration,  renal functions improved. ? ?Major depressive disorder, single episode, unspecified ?Continue Zoloft ? ?Uncontrolled type 2 diabetes mellitus with hyperglycemia, without long-term current use of insulin ([place]) ?HbA1c at 7.6.   ?Patient takes metformin 500 daily.  Would recommend increase to 1000 twice daily for better control. ? ?Overweight (BMI 25.0-29.9) ?Patient meets criteria with BMI greater than 25 ? ? ?Body mass index is 26.52 kg/m?.  ? ? ?Pain control - [employer] Controlled Substance Reporting System database was reviewed. and patient was instructed, not to drive, operate heavy machinery, perform activities at heights, swimming or participation in water activities or provide baby-sitting services while on Pain, Sleep and Anxiety Medications; until their outpatient Physician has advised to do so again. Also recommended to not to take more than prescribed  Pain, Sleep and Anxiety Medications.  ? ?Consultants: Neurology, ENT, ophthalmology ?Procedures performed: CT, echo, MRI ?Disposition: Home ?Diet recommendation:  ?Discharge Diet Orders (From admission, onward)  ? ?  Start      Ordered  ? 03/07/22 0000  Diet - low sodium heart healthy       ? 03/07/22 1330  ? 03/07/22 0000  Diet Carb Modified       ? 03/07/22 1330  ? ?  ?  ? ?  ? ?Carb modified diet ?DISCHARGE MEDICATION: ?Allergies as of 03/07/2022   ?No Known Allergies ?  ? ?  ?Medication List  ?  ? ?STOP taking these medications   ? ?Fish Oil 1000 MG Cpdr ?  ?propranolol 20 MG tablet ?Commonly known as: INDERAL ?  ?rosuvastatin 5 MG tablet ?Commonly known as: CRESTOR ?  ?simvastatin 40 MG tablet ?Commonly known as: ZOCOR ?  ? ?  ? ?TAKE these medications   ? ?aspirin 81 MG tablet ?Take 81 mg by mouth daily. ?  ?atorvastatin 40 MG tablet ?Commonly known as: LIPITOR ?Take 1 tablet (40 mg total) by mouth daily. ?Start taking on: March 08, 2022 ?  ?benazepril 40 MG tablet ?Commonly known as: LOTENSIN ?Take 40 mg by mouth daily. ?  ?loratadine 10 MG tablet ?Commonly known as: CLARITIN ?Take 10 mg by mouth daily. ?  ?metFORMIN 500 MG tablet ?Commonly known as: GLUCOPHAGE ?Take 500 mg by mouth daily with supper. ?  ?multivitamin tablet ?Take 1 tablet by mouth daily. ?  ?NON FORMULARY ?Calcium 600 + D    BID ?  ?pantoprazole 40 MG tablet ?Commonly known as: PROTONIX ?Take 40 mg by mouth daily. ?  ?pindolol 5 MG tablet ?Commonly known as: VISKEN ?Take 5 mg by mouth daily. ?  ?predniSONE 20 MG tablet ?Commonly known as: DELTASONE ?Take 3 tablets (60 mg total) by mouth daily. ?  ?sertraline 100 MG tablet ?Commonly known as: ZOLOFT ?Take 100 mg by mouth daily. ?  ?valACYclovir 1000 MG tablet ?Commonly known as: Valtrex ?Take 1 tablet (1,000 mg total) by mouth 3 (three) times daily for 7 days. ?  ? ?  ? ? Follow-up Information   ? ? Clyde Canterbury, MD. Go on 03/08/2022.   ?Specialty: Otolaryngology ?Why: Office will call patient with follow up appt ?Contact information: ?[address] ?[address] ?[place] [address] ?[phone] ? ? ?  ?  ? ?  ?  ? ?  ? ?Discharge Exam: ?Filed Weights  ? 03/05/22 1452  ?Weight: 65.8 kg  ? ? ?General  exam: Appears comfortable, not in any acute distress. ?Respiratory system: CTA bilaterally, no wheezing, no crackles, normal respiratory effort ?Cardiovascular system: S1-S2 heard, regular rate and rhythm, no murmur. ?Gastrointestinal system: Abdomen is soft, non tender, non distended, BS+. ?Central nervous system: Alert, oriented x3, no focal neurological deficits ?Extremities: No edema, no cyanosis, no clubbing. ?Psychiatry: Mood, insight, judgment normal. ? ? ?Condition at discharge: good ? ?The results of significant diagnostics from this hospitalization (including imaging, microbiology, ancillary and laboratory) are listed below for reference.  ? ?Imaging Studies: ?CT HEAD WO CONTRAST ? ?Result Date: 03/05/2022 ?CLINICAL DATA:  Transient ischemic attack (TIA) EXAM: CT HEAD WITHOUT CONTRAST TECHNIQUE: Contiguous axial images were obtained from the base of the skull through the vertex without intravenous contrast. RADIATION DOSE REDUCTION: This exam was performed according to the departmental dose-optimization program which includes automated exposure control, adjustment of the mA and/or kV according to patient size and/or use of iterative  reconstruction technique. COMPARISON:  None. FINDINGS: Brain: No evidence of acute large vascular territory infarction, hemorrhage, hydrocephalus, extra-axial collection or mass lesion/mass effect. Patchy white matter hypodensities, nonspecific but compatible with chronic microvascular ischemic disease. Mild cerebral atrophy. Vascular: No hyperdense vessel identified. Skull: No acute fracture. Sinuses/Orbits: Visualized sinuses are clear. No acute orbital findings. Other: No mastoid effusions. IMPRESSION: 1. No evidence of acute intracranial abnormality. 2. 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 Electronically Signed   By: Margaretha Sheffield M.D.   On: 03/05/2022 15:37  ? ?MR ANGIO HEAD WO CONTRAST ? ?Result Date: 03/06/2022 ?CLINICAL DATA:  Stroke-like  symptoms EXAM: MRI HEAD WITHOUT CONTRAST MRA HEAD WITHOUT CONTRAST TECHNIQUE: Multiplanar, multi-echo pulse sequences of the brain and surrounding structures were acquired without intravenous contrast. Angio

## 2022-03-07 NOTE — Progress Notes (Signed)
Patient discharging home with follow-up appointments. Discharge instructions given. Patient educated. Son at bedside for transportation home. ?

## 2022-03-07 NOTE — Consult Note (Addendum)
NEUROLOGY CONSULTATION NOTE   Date of service: March 07, 2022 Patient Name: Kathleen Frazier MRN:  [id number] DOB:  10/14/1956 Reason for consult: R facial droop Requesting physician: Dr. Virginia Rochester _ _ _   _ __   _ __ _ _  __ __   _ __   __ _  History of Present Illness   This is a 66 year old woman with history of hypertension, hyperlipidemia, essential tremor, recent "optic nerve stroke" who presents with right-sided facial weakness and numbness.  Patient reports that she developed a blob in her vision in the right eye only which settled to cause right inferior hemifield visual loss in the right eye only approximately 1 week ago.  She was seen by her ophthalmologist who diagnosed her with an optic nerve stroke.  Patient did not have further stroke work-up at that time.  Patient then presented to the emergency department 2 days ago with complaints of right facial fullness and numbness.  She had noticed this intermittently for a few days prior but on that day the symptoms did not resolve and have persisted since that time.  She is noted to have mild right-sided facial weakness involving the upper and lower face. Stroke workup this admission:  MRI brain wo contrast - WNL MRA head wo contrast - WNL Carotid US - no hemodynamically-significant stenoses TTE - no etiology for TIA or stroke identified  CNS imaging personally reviewed  Stroke Labs     Component Value Date/Time   CHOL 165 03/06/2022 0515   TRIG 193 (H) 03/06/2022 0515   HDL 54 03/06/2022 0515   CHOLHDL 3.1 03/06/2022 0515   VLDL 39 03/06/2022 0515   LDLCALC 72 03/06/2022 0515    Lab Results  Component Value Date/Time   HGBA1C 7.6 (H) 03/05/2022 02:55 PM       ROS   Per HPI: all other systems reviewed and are negative  Past History   I have reviewed the following:  Past Medical History:  Diagnosis Date   Anxiety    Depression    GERD (gastroesophageal reflux disease)    H/O hiatal hernia     Hyperlipemia    Hypertension    Osteoporosis    Seasonal allergies    Tremor, essential    Past Surgical History:  Procedure Laterality Date   BREAST CYST ASPIRATION  30+ years ago   CARDIAC VALVE REPLACEMENT     mitral valve replacement   COLONOSCOPY N/A 12/28/2013   Procedure: COLONOSCOPY;  Surgeon: West Bali, MD;  Location: [facility];  Service: Endoscopy;  Laterality: N/A;  10:45 AM   right leg has rod and screws     Family History  Problem Relation Age of Onset   Stroke Mother 42       brain stem stroke   Breast cancer Neg Hx    Social History   Socioeconomic History   Marital status: Divorced    Spouse name: Not on file   Number of children: Not on file   Years of education: Not on file   Highest education level: Not on file  Occupational History   Not on file  Tobacco Use   Smoking status: Never   Smokeless tobacco: Not on file  Substance and Sexual Activity   Alcohol use: Not Currently    Comment: occasional   Drug use: Never   Sexual activity: Not Currently  Other Topics Concern   Not on file  Social History Narrative   Not  on file   Social Determinants of Health   Financial Resource Strain: Not on file  Food Insecurity: Not on file  Transportation Needs: Not on file  Physical Activity: Not on file  Stress: Not on file  Social Connections: Not on file   No Known Allergies  Medications   Medications Prior to Admission  Medication Sig Dispense Refill Last Dose   aspirin 81 MG tablet Take 81 mg by mouth daily.   03/04/2022 at 2000   benazepril (LOTENSIN) 40 MG tablet Take 40 mg by mouth daily.   03/05/2022 at 0800   loratadine (CLARITIN) 10 MG tablet Take 10 mg by mouth daily.   03/05/2022 at 0800   metFORMIN (GLUCOPHAGE) 500 MG tablet Take 500 mg by mouth daily with supper.   03/04/2022 at 1700   Multiple Vitamin (MULTIVITAMIN) tablet Take 1 tablet by mouth daily.   03/05/2022 at 0800   NON FORMULARY Calcium 600 + D    BID   03/05/2022 at 0800    pantoprazole (PROTONIX) 40 MG tablet Take 40 mg by mouth daily.   03/05/2022 at 0800   pindolol (VISKEN) 5 MG tablet Take 5 mg by mouth daily.    03/04/2022 at 2000   rosuvastatin (CRESTOR) 5 MG tablet Take 5 mg by mouth every other day.   03/04/2022 at 2000   sertraline (ZOLOFT) 100 MG tablet Take 100 mg by mouth daily.   03/04/2022 at 2000   Omega-3 Fatty Acids (FISH OIL) 1000 MG CPDR Take by mouth 2 (two) times daily. (Patient not taking: Reported on 03/05/2022)   Not Taking   propranolol (INDERAL) 20 MG tablet Take 1 tablet (20 mg total) by mouth 2 (two) times daily as needed. (Patient not taking: Reported on 03/05/2022) 60 tablet 11 Not Taking   simvastatin (ZOCOR) 40 MG tablet Take 40 mg by mouth every other day.  (Patient not taking: Reported on 03/05/2022)   Not Taking      Current Facility-Administered Medications:    0.9 %  sodium chloride infusion, , Intravenous, Continuous, Hollice Espy, MD, Stopped at 03/07/22 1001   acetaminophen (TYLENOL) tablet 650 mg, 650 mg, Oral, Q4H PRN, 650 mg at 03/05/22 2312 **OR** acetaminophen (TYLENOL) 160 MG/5ML solution 650 mg, 650 mg, Per Tube, Q4H PRN **OR** acetaminophen (TYLENOL) suppository 650 mg, 650 mg, Rectal, Q4H PRN, Jonah Blue, MD   aspirin EC tablet 81 mg, 81 mg, Oral, Daily, Jonah Blue, MD, 81 mg at 03/07/22 1610   atorvastatin (LIPITOR) tablet 40 mg, 40 mg, Oral, Daily, Jonah Blue, MD, 40 mg at 03/07/22 0813   clopidogrel (PLAVIX) tablet 75 mg, 75 mg, Oral, Daily, Jefferson Fuel, MD, 75 mg at 03/07/22 0813   enoxaparin (LOVENOX) injection 40 mg, 40 mg, Subcutaneous, QHS, Jonah Blue, MD, 40 mg at 03/06/22 2119   loratadine (CLARITIN) tablet 10 mg, 10 mg, Oral, Daily, Jonah Blue, MD, 10 mg at 03/07/22 0813   multivitamin with minerals tablet 1 tablet, 1 tablet, Oral, Daily, Hollice Espy, MD, 1 tablet at 03/07/22 0813   pantoprazole (PROTONIX) EC tablet 40 mg, 40 mg, Oral, Daily, Jonah Blue, MD, 40 mg  at 03/07/22 0813   senna-docusate (Senokot-S) tablet 1 tablet, 1 tablet, Oral, QHS PRN, Jonah Blue, MD   sertraline (ZOLOFT) tablet 100 mg, 100 mg, Oral, Noemi Chapel, MD  Vitals   Vitals:   03/06/22 2010 03/07/22 0026 03/07/22 0520 03/07/22 0723  BP: 104/73 104/71 112/73 124/88  Pulse: (!) 101 (!) 104 Marland Kitchen)  101 (!) 106  Resp: 20 20 20 16   Temp: 98.3 F (36.8 C) 97.9 F (36.6 C) 97.7 F (36.5 C) 98.2 F (36.8 C)  TempSrc: Oral Oral  Oral  SpO2: 97% 98% 95% 97%  Weight:      Height:         Body mass index is 26.52 kg/m.  Physical Exam   Physical Exam Gen: A&O x4, NAD HEENT: Atraumatic, normocephalic;mucous membranes moist; oropharynx clear, tongue without atrophy or fasciculations. Neck: Supple, trachea midline. Resp: CTAB, no w/r/r CV: RRR, no m/g/r; nml S1 and S2. 2+ symmetric peripheral pulses. Abd: soft/NT/ND; nabs x 4 quad Extrem: Nml bulk; no cyanosis, clubbing, or edema.  Neuro: *MS: A&O x4. Follows multi-step commands.  *Speech: fluid, nondysarthric, able to name and repeat *CN:    I: Deferred   II,III: PERRLA, unable to differentiate between 1 and 2 fingers in either inferior quadrant of R eye only, otherwise VFF by confrontation, optic discs unable to be visualized 2/2 pupillary constriction   III,IV,VI: EOMI w/o nystagmus, no ptosis   V: slightly impaired sensation V2 on R   VII: Mild facial weakness upper and lower R face, forehead involved   VIII: Hearing intact to voice   IX,X: Voice normal, palate elevates symmetrically    XI: SCM/trap 5/5 bilat   XII: Tongue protrudes midline, no atrophy or fasciculations   *Motor:   Normal bulk.  No tremor, rigidity or bradykinesia. No pronator drift.    Strength: Dlt Bic Tri WrE WrF FgS Gr HF KnF KnE PlF DoF    Left 5 5 5 5 5 5 5 5 5 5 5 5     Right 5 5 5 5 5 5 5 5 5 5 5 5     *Sensory: Intact to light touch, pinprick, temperature vibration throughout. Symmetric. Propioception intact bilat.  No  double-simultaneous extinction.  *Coordination:  Finger-to-nose, heel-to-shin, rapid alternating motions were intact. *Reflexes:  2+ and symmetric throughout without clonus; toes down-going bilat *Gait: normal base, normal stride, normal turn. Negative Romberg.  NIHSS = 3 for visual fields, facial droop, sensory   Premorbid mRS = 1   Labs   CBC:  Recent Labs  Lab 02/28/22 1210 03/05/22 1455  WBC 7.0 7.2  NEUTROABS 4.2  --   HGB 12.7 13.0  HCT 38.8 39.5  MCV 91.3 92.5  PLT 292 322    Basic Metabolic Panel:  Lab Results  Component Value Date   NA 139 03/07/2022   K 4.0 03/07/2022   CO2 26 03/07/2022   GLUCOSE 138 (H) 03/07/2022   BUN 24 (H) 03/07/2022   CREATININE 1.02 (H) 03/07/2022   CALCIUM 9.6 03/07/2022   GFRNONAA >60 03/07/2022   Lipid Panel:  Lab Results  Component Value Date   LDLCALC 72 03/06/2022   HgbA1c:  Lab Results  Component Value Date   HGBA1C 7.6 (H) 03/05/2022   Urine Drug Screen:     Component Value Date/Time   LABOPIA NONE DETECTED 03/05/2022 1828   COCAINSCRNUR NONE DETECTED 03/05/2022 1828   LABBENZ NONE DETECTED 03/05/2022 1828   AMPHETMU NONE DETECTED 03/05/2022 1828   THCU NONE DETECTED 03/05/2022 1828   LABBARB NONE DETECTED 03/05/2022 1828    Alcohol Level No results found for: ETH   Impression   This is a 66 year old woman with history of hypertension, hyperlipidemia, essential tremor, recent "optic nerve stroke" resulting in visual loss in both inferior quadrants of R eye only who presents with right-sided facial weakness and  numbness.  On examination R facial weakness involves upper and lower face and is more c/w Bell's palsy, mild. Stroke workup here has been negative. Will obtain additional contrasted scans of brain parenchyma as well as MRI orbits with and without contrast to rule out skull base lesions or demyelinating disease.  Recommendations   - MRI brain w contrast - MRI orbits with and without contrast - ASA  81mg  daily + plavix 75mg  daily x21 days f/b ASA 81mg  daily monotherapy after that - Continue atorvastatin 40mg  daily ______________________________________________________________________   Thank you for the opportunity to take part in the care of this patient. If you have any further questions, please contact the neurology consultation attending.  Signed,  Bing Neighbors, MD [facility] [phone]  If 7pm- 7am, please page neurology on call as listed in AMION.

## 2022-04-24 DIAGNOSIS — H47011 Ischemic optic neuropathy, right eye: Secondary | ICD-10-CM | POA: Insufficient documentation

## 2022-04-24 DIAGNOSIS — F330 Major depressive disorder, recurrent, mild: Secondary | ICD-10-CM | POA: Insufficient documentation

## 2022-04-24 DIAGNOSIS — Z9889 Other specified postprocedural states: Secondary | ICD-10-CM | POA: Insufficient documentation

## 2022-04-24 DIAGNOSIS — I4719 Other supraventricular tachycardia: Secondary | ICD-10-CM | POA: Insufficient documentation

## 2022-04-24 DIAGNOSIS — M818 Other osteoporosis without current pathological fracture: Secondary | ICD-10-CM | POA: Insufficient documentation

## 2022-11-14 ENCOUNTER — Other Ambulatory Visit
Admission: RE | Admit: 2022-11-14 | Discharge: 2022-11-14 | Disposition: A | Discharge status: Home / Self Care | Payer: Medicare PPO | Source: Ambulatory Visit | Attending: Ophthalmology | Admitting: Ophthalmology

## 2022-11-14 DIAGNOSIS — H47012 Ischemic optic neuropathy, left eye: Secondary | ICD-10-CM | POA: Diagnosis present

## 2022-11-14 LAB — CBC
HCT: 38.6 % (ref 36.0–46.0)
Hemoglobin: 12.7 g/dL (ref 12.0–15.0)
MCH: 32.1 pg (ref 26.0–34.0)
MCHC: 32.9 g/dL (ref 30.0–36.0)
MCV: 97.5 fL (ref 80.0–100.0)
Platelets: 261 10*3/uL (ref 150–400)
RBC: 3.96 MIL/uL (ref 3.87–5.11)
RDW: 13.1 % (ref 11.5–15.5)
WBC: 5.7 10*3/uL (ref 4.0–10.5)
nRBC: 0 % (ref 0.0–0.2)

## 2022-11-14 LAB — C-REACTIVE PROTEIN: CRP: 1 mg/dL — ABNORMAL HIGH (ref <1.0)

## 2022-11-14 LAB — SEDIMENTATION RATE: Sed Rate: 28 mm/hr (ref 0–30)

## 2022-11-16 ENCOUNTER — Other Ambulatory Visit: Payer: Self-pay | Admitting: Internal Medicine

## 2022-11-16 DIAGNOSIS — H47012 Ischemic optic neuropathy, left eye: Secondary | ICD-10-CM | POA: Insufficient documentation

## 2022-11-16 DIAGNOSIS — G459 Transient cerebral ischemic attack, unspecified: Secondary | ICD-10-CM

## 2022-11-22 ENCOUNTER — Ambulatory Visit
Admission: RE | Admit: 2022-11-22 | Discharge: 2022-11-22 | Disposition: A | Discharge status: Home / Self Care | Payer: Medicare PPO | Source: Ambulatory Visit | Attending: Internal Medicine | Admitting: Internal Medicine

## 2022-11-22 DIAGNOSIS — G459 Transient cerebral ischemic attack, unspecified: Secondary | ICD-10-CM | POA: Insufficient documentation

## 2022-11-22 MED ORDER — IOHEXOL 350 MG/ML SOLN
75.0000 mL | Freq: Once | INTRAVENOUS | Status: AC | PRN
  Administered 2022-11-22: 75 mL via INTRAVENOUS

## 2022-12-21 ENCOUNTER — Telehealth (INDEPENDENT_AMBULATORY_CARE_PROVIDER_SITE_OTHER): Payer: Self-pay

## 2022-12-21 ENCOUNTER — Encounter (INDEPENDENT_AMBULATORY_CARE_PROVIDER_SITE_OTHER): Payer: Self-pay | Admitting: Vascular Surgery

## 2022-12-21 ENCOUNTER — Ambulatory Visit (INDEPENDENT_AMBULATORY_CARE_PROVIDER_SITE_OTHER): Payer: Medicare PPO | Admitting: Vascular Surgery

## 2022-12-21 VITALS — BP 114/79 | HR 87 | Ht 62.0 in | Wt 119.0 lb

## 2022-12-21 DIAGNOSIS — I10 Essential (primary) hypertension: Secondary | ICD-10-CM

## 2022-12-21 DIAGNOSIS — E1165 Type 2 diabetes mellitus with hyperglycemia: Secondary | ICD-10-CM

## 2022-12-21 DIAGNOSIS — R2990 Unspecified symptoms and signs involving the nervous system: Secondary | ICD-10-CM

## 2022-12-21 NOTE — Assessment & Plan Note (Signed)
blood glucose control important in reducing the progression of atherosclerotic disease. Also, involved in wound healing. On appropriate medications.  

## 2022-12-21 NOTE — Progress Notes (Signed)
Patient ID: Kathleen Frazier, female   DOB: [birth year], 67 y.o.   MRN: [id number]  Chief Complaint  Patient presents with   New Patient (Initial Visit)    np. consult. regarding giant cell arteritis and possible need for biopsy. referred by porfioio, william.    HPI Kathleen Frazier is a 67 y.o. female.  I am asked to see the patient by Dr. George Ina for evaluation of visual changes concerning for temporal arteritis and evaluation for temporal artery biopsy.  She had an episode of visual changes about 6 months ago affecting the right eye.  This was associated with some facial droop and Bell's palsy type symptoms.  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  Her visual changes on the left or not entirely resolved but seem to be evolving.  She had a CRP and a sed rate that were at the upper limits of normal to slightly elevated.  Given her symptoms, there was concern for temporal arteritis and she is referred for further evaluation and treatment.     Past Medical History:  Diagnosis Date   Anxiety    Depression    GERD (gastroesophageal reflux disease)    H/O hiatal hernia    Hyperlipemia    Hypertension    Osteoporosis    Seasonal allergies    Tremor, essential     Past Surgical History:  Procedure Laterality Date   BREAST CYST ASPIRATION  30+ years ago   CARDIAC VALVE REPLACEMENT     mitral valve replacement   COLONOSCOPY N/A 12/28/2013   Procedure: COLONOSCOPY;  Surgeon: Danie Binder, MD;  Location: [facility];  Service: Endoscopy;  Laterality: N/A;  10:45 AM   right leg has rod and screws       Family History  Problem Relation Age of Onset   Stroke Mother 40       brain stem stroke   Breast cancer Neg Hx   No bleeding or clotting disorders   Social History   Tobacco Use   Smoking status: Never   Smokeless tobacco: Never  Substance Use Topics   Alcohol use: Not Currently    Comment:  occasional   Drug use: Never     No Known Allergies  Current Outpatient Medications  Medication Sig Dispense Refill   aspirin 81 MG tablet Take 81 mg by mouth daily.     atorvastatin (LIPITOR) 40 MG tablet Take 1 tablet (40 mg total) by mouth daily. 30 tablet 1   clopidogrel (PLAVIX) 75 MG tablet Take 75 mg by mouth daily.     loratadine (CLARITIN) 10 MG tablet Take 10 mg by mouth daily.     metFORMIN (GLUCOPHAGE) 500 MG tablet Take 500 mg by mouth daily with supper.     Multiple Vitamin (MULTIVITAMIN) tablet Take 1 tablet by mouth daily.     NON FORMULARY Calcium 600 + D    BID     OZEMPIC, 0.25 OR 0.5 MG/DOSE, 2 MG/3ML SOPN SMARTSIG:0.25 Milligram(s) SUB-Q Once a Week     pantoprazole (PROTONIX) 40 MG tablet Take 40 mg by mouth daily.     propranolol ER (INDERAL LA) 160 MG SR capsule Take 160 mg by mouth daily.     sertraline (ZOLOFT) 100 MG tablet Take 100 mg by mouth daily.     No current facility-administered medications for this visit.      REVIEW OF SYSTEMS (  Negative unless checked)  Constitutional: []$ Weight loss  []$ Fever  []$ Chills Cardiac: []$ Chest pain   []$ Chest pressure   []$ Palpitations   []$ Shortness of breath when laying flat   []$ Shortness of breath at rest   []$ Shortness of breath with exertion. Vascular:  []$ Pain in legs with walking   []$ Pain in legs at rest   []$ Pain in legs when laying flat   []$ Claudication   []$ Pain in feet when walking  []$ Pain in feet at rest  []$ Pain in feet when laying flat   []$ History of DVT   []$ Phlebitis   []$ Swelling in legs   []$ Varicose veins   []$ Non-healing ulcers Pulmonary:   []$ Uses home oxygen   []$ Productive cough   []$ Hemoptysis   []$ Wheeze  []$ COPD   []$ Asthma Neurologic:  []$ Dizziness  []$ Blackouts   []$ Seizures   []$ History of stroke   []$ History of TIA  []$ Aphasia   [x]$ Temporary blindness   []$ Dysphagia   []$ Weakness or numbness in arms   []$ Weakness or numbness in legs Musculoskeletal:  [x]$ Arthritis   []$ Joint swelling   []$ Joint pain   []$ Low back  pain Hematologic:  []$ Easy bruising  []$ Easy bleeding   []$ Hypercoagulable state   []$ Anemic  []$ Hepatitis Gastrointestinal:  []$ Blood in stool   []$ Vomiting blood  [x]$ Gastroesophageal reflux/heartburn   []$ Abdominal pain Genitourinary:  []$ Chronic kidney disease   []$ Difficult urination  []$ Frequent urination  []$ Burning with urination   []$ Hematuria Skin:  []$ Rashes   []$ Ulcers   []$ Wounds Psychological:  [x]$ History of anxiety   [x]$  History of major depression.    Physical Exam BP 114/79   Pulse 87   Ht 5' 2"$  (1.575 m)   Wt 119 lb (54 kg)   BMI 21.77 kg/m  Gen:  WD/WN, NAD Head: [place]/AT, No temporalis wasting. Prominent temp pulse not noted. Ear/Nose/Throat: Hearing grossly intact, nares w/o erythema or drainage, oropharynx w/o Erythema/Exudate Eyes: Conjunctiva clear, sclera non-icteric  Neck: trachea midline.  No JVD.  Pulmonary:  Good air movement, respirations not labored, no use of accessory muscles  Cardiac: RRR, no JVD Vascular:  Vessel Right Left  Radial Palpable Palpable                                   Gastrointestinal:. No masses, surgical incisions, or scars. Musculoskeletal: M/S 5/5 throughout.  Extremities without ischemic changes.  No deformity or atrophy. No edema. Neurologic: Sensation grossly intact in extremities.  Symmetrical.  Speech is fluent. Motor exam as listed above. Psychiatric: Judgment intact, Mood & affect appropriate for pt's clinical situation. Dermatologic: No rashes or ulcers noted.  No cellulitis or open wounds.    Radiology CT ANGIO HEAD NECK W WO CM  Result Date: 11/23/2022 CLINICAL DATA:  67 year old female with partial vision loss on the left. Headaches and jaw pain. EXAM: CT ANGIOGRAPHY HEAD AND NECK TECHNIQUE: Multidetector CT imaging of the head and neck was performed using the standard protocol during bolus administration of intravenous contrast. Multiplanar CT image reconstructions and MIPs were obtained to evaluate the vascular anatomy.  Carotid stenosis measurements (when applicable) are obtained utilizing NASCET criteria, using the distal internal carotid diameter as the denominator. RADIATION DOSE REDUCTION: This exam was performed according to the departmental dose-optimization program which includes automated exposure control, adjustment of the mA and/or kV according to patient size and/or use of iterative reconstruction technique. CONTRAST:  94m OMNIPAQUE IOHEXOL 350 MG/ML SOLN COMPARISON:  Brain MRI and orbit MRI 03/06/2022, head CT 03/05/2022. FINDINGS: CT  HEAD Brain: Stable cerebral volume. No midline shift, mass effect, or evidence of intracranial mass lesion. No acute intracranial hemorrhage identified. No ventriculomegaly. Patchy bilateral cerebral white matter hypodensity is moderate for age and appears similar to MRI signal changes last year. No cortically based acute infarct identified. No cortical encephalomalacia identified. Calvarium and skull base: No acute osseous abnormality identified. Hyperostosis of the calvarium, normal variant Paranasal sinuses: Visualized paranasal sinuses and mastoids are clear. Orbits: Symmetric, negative orbits soft tissues. Visualized scalp soft tissues are within normal limits. CTA NECK Skeleton: Mandible intact. TMJ normally located. Severe right TMJ degeneration with osteophytosis and subchondral cysts at the right mandible condyle. Widespread cervical spine degeneration, bulky disc and endplate degeneration. Previous sternotomy. No acute osseous abnormality identified. Upper chest: Visible major airways and lungs appear negative. Visible central pulmonary arteries are patent. No superior mediastinal lymphadenopathy. Other neck: No acute finding. Aortic arch: Prior sternotomy but no obvious CABG is visible. 3 vessel arch configuration with mild arch tortuosity and atherosclerosis. Right carotid system: Brachiocephalic artery and right CCA origin are normal. Mildly tortuous right CCA. Capacious  carotid bifurcation and no plaque or stenosis to the skull base. Left carotid system: Similar mild tortuosity and capacious carotid bifurcation with no plaque or stenosis. Vertebral arteries: Minimal proximal right subclavian artery plaque without stenosis. Normal right vertebral artery origin. Tortuous right V1 segment. Right vertebral is mildly dominant and is patent to the skull base with no significant plaque or stenosis. Normal proximal left subclavian artery and left vertebral artery origin. Tortuous left V1 segment. Mildly non dominant left vertebral artery is patent to the skull base with no significant plaque or stenosis. CTA HEAD Posterior circulation: Fairly codominant distal vertebral arteries with no plaque or stenosis to the vertebrobasilar junction. Patent PICA and AICA origins. Patent basilar artery with mild tortuosity but no stenosis. Normal SCA origins. Patent right PCA origin. Fetal left PCA origin with diminutive or absent P1 segment. Right posterior communicating artery is also present. Tortuous left P2 segment but bilateral PCA branches are patent with no significant stenosis. Anterior circulation: Both ICA siphons are patent. On the left there is no siphon plaque or stenosis, with normal left posterior communicating artery origin. And patent left ophthalmic artery origin (series 15, image 104). Right ICA siphon is also normal without stenosis. Patent carotid termini. Patent MCA and ACA origins. Mild M1 and A1 tortuosity bilaterally. Normal anterior communicating artery. Bilateral ACA branches are within normal limits. Left MCA M1 segment and trifurcation are patent without stenosis. Right MCA M1 segment and trifurcation are patent without stenosis. Bilateral MCA branches are within normal limits. Venous sinuses: Early contrast timing, grossly patent. Anatomic variants: Mildly dominant right vertebral artery. Fetal left PCA origin. Review of the MIP images confirms the above findings  IMPRESSION: 1. Negative CTA Head and Neck; mild generalized arterial tortuosity but no significant atherosclerosis or stenosis. 2. Stable CT appearance of the brain since last year. Moderate for age cerebral white matter changes most commonly due to chronic small vessel disease. 3. 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 Electronically Signed   By: Genevie Ann M.D.   On: 11/23/2022 06:37    Labs Recent Results (from the past 2160 hour(s))  CBC     Status: None   Collection Time: 11/14/22 10:58 AM  Result Value Ref Range   WBC 5.7 4.0 - 10.5 K/uL   RBC 3.96 3.87 - 5.11 MIL/uL   Hemoglobin 12.7 12.0 - 15.0 g/dL   HCT 38.6  36.0 - 46.0 %   MCV 97.5 80.0 - 100.0 fL   MCH 32.1 26.0 - 34.0 pg   MCHC 32.9 30.0 - 36.0 g/dL   RDW 13.1 11.5 - 15.5 %   Platelets 261 150 - 400 K/uL   nRBC 0.0 0.0 - 0.2 %    Comment: Performed at [facility], [address] [place], [place] [address]  C-reactive protein     Status: Abnormal   Collection Time: 11/14/22 10:58 AM  Result Value Ref Range   CRP 1.0 (H) <1.0 mg/dL    Comment: Performed at [facility] [address] [place], [place] [address]  Sedimentation rate     Status: None   Collection Time: 11/14/22 10:58 AM  Result Value Ref Range   Sed Rate 28 0 - 30 mm/hr    Comment: Performed at [facility], [address] [place], [place] [address]    Assessment/Plan:  Stroke-like symptoms The patient has symptoms concerning for temporal arteritis and we are asked about doing a temporal artery biopsy.  This is certainly reasonable.  There is no way to definitively rule out temporal arteritis without biopsy.  We will plan on the left temporal artery biopsy sometime next week.  Risks and benefits are discussed.  Uncontrolled type 2 diabetes mellitus with hyperglycemia, without long-term current use of insulin (HCC) blood glucose control important in reducing the progression of atherosclerotic  disease. Also, involved in wound healing. On appropriate medications.   Essential hypertension blood pressure control important in reducing the progression of atherosclerotic disease. On appropriate oral medications.      Leotis Pain 12/21/2022, 11:18 AM   This note was created with Dragon medical transcription system.  Any errors from dictation are unintentional.

## 2022-12-21 NOTE — Assessment & Plan Note (Signed)
blood pressure control important in reducing the progression of atherosclerotic disease. On appropriate oral medications.  

## 2022-12-21 NOTE — Patient Instructions (Signed)
Temporal Artery Biopsy  Arteries are blood vessels that carry blood from the heart to the rest of the body. Temporal arteries are found on the side of the head and between the ears and eyes. During a temporal artery biopsy, a sample of the temporal artery is removed and examined under a microscope. This procedure may be done to see if you have a condition that causes your arteries to become swollen or inflamed (temporal arteritis or giant cell arteritis). Tell a health care provider about: Any allergies you have. All medicines you are taking, including vitamins, herbs, eye drops, creams, and over-the-counter medicines. Any problems you or family members have had with anesthetic medicines. Any blood disorders you have. Any surgeries you have had. Any medical conditions you have or have had. Whether you are pregnant or may be pregnant. What are the risks? Generally, this is a safe procedure. However, problems may occur, including: Bleeding. A collection of blood under the skin (hematoma). Infection. Nerve damage in the temples. This can cause numbness or make the muscles in your face weak. Scarring. On the scalp, hair may not grow around the scar. What happens before the procedure? You may have blood tests to make sure that your blood clots normally. Ask your health care provider about: Changing or stopping your regular medicines. This is especially important if you are taking diabetes medicines or blood thinners. Taking medicines such as aspirin and ibuprofen. These medicines can thin your blood. Do not take these medicines unless your health care provider tells you to take them. Taking over-the-counter medicines, vitamins, herbs, and supplements. Follow instructions from your health care provider about eating or drinking restrictions. Plan to have someone take you home from the hospital or clinic. If you will be going home right after the procedure, plan to have someone with you for 24  hours. Ask your health care provider: How your surgery site will be marked. What steps will be taken to help prevent infection. These may include: Removing hair at the surgery site. Washing skin with a germ-killing soap. Taking antibiotic medicine. What happens during the procedure? Small monitors will be put on your body. They will be used to check your heart, blood pressure, and oxygen level. An IV will be inserted into one of your veins. You will be given one or more of the following: A medicine to help you relax (sedative). A medicine to numb the area (local anesthetic). A tool called a Doppler may be used to find the artery. An incision will be made over the temporal artery. Two clamps will be placed on the artery. Then, a small piece of the artery between the clamps will be cut and removed. The remaining ends of the artery will be closed with stitches (sutures) to avoid bleeding. The clamps will then be removed. The incision will be closed with sutures. A bandage (dressing) may be placed on the incision. The artery piece that was taken out will be checked under a microscope. The procedure may vary among health care providers and hospitals. What happens after the procedure? Your blood pressure, heart rate, breathing rate, and blood oxygen level may be monitored until you leave the hospital or clinic. You may be given medicine for pain. Do not drive for 24 hours if you were given a sedative during your procedure. Summary During a temporal artery biopsy, a sample of the temporal artery is removed and examined under a microscope. This procedure may be done to see if you have a condition  that causes your arteries to become swollen or inflamed (temporal arteritis or giant cell arteritis). Before the procedure, follow instructions from your health care provider about changing or stopping your medicines and about any eating or drinking restrictions. Plan to have someone take you home after  the procedure and have someone with you for 24 hours. During the procedure, the incision will be closed with sutures, and a bandage (dressing) may be placed on the incision. This information is not intended to replace advice given to you by your health care provider. Make sure you discuss any questions you have with your health care provider. Document Revised: 01/10/2021 Document Reviewed: 01/10/2021 Elsevier Patient Education  [place].

## 2022-12-21 NOTE — Telephone Encounter (Signed)
Spoke with the patient and she is scheduled with Dr. Lucky Cowboy on 12/27/22 for a left temporal artery biopsy at [facility]. Pre-op phone call is on 12/25/22 between 8-1 pm. Pre-surgical instructions were discussed and will be mailed.

## 2022-12-21 NOTE — Assessment & Plan Note (Signed)
The patient has symptoms concerning for temporal arteritis and we are asked about doing a temporal artery biopsy.  This is certainly reasonable.  There is no way to definitively rule out temporal arteritis without biopsy.  We will plan on the left temporal artery biopsy sometime next week.  Risks and benefits are discussed.

## 2022-12-21 NOTE — H&P (View-Only) (Signed)
Patient ID: Kathleen Frazier, female   DOB: May 13, 1956, 67 y.o.   MRN: [id number]  Chief Complaint  Patient presents with   New Patient (Initial Visit)    np. consult. regarding giant cell arteritis and possible need for biopsy. referred by porfioio, william.    HPI Kathleen Frazier is a 67 y.o. female.  I am asked to see the patient by Dr. George Ina for evaluation of visual changes concerning for temporal arteritis and evaluation for temporal artery biopsy.  She had an episode of visual changes about 6 months ago affecting the right eye.  This was associated with some facial droop and Bell's palsy type symptoms.  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  Her visual changes on the left or not entirely resolved but seem to be evolving.  She had a CRP and a sed rate that were at the upper limits of normal to slightly elevated.  Given her symptoms, there was concern for temporal arteritis and she is referred for further evaluation and treatment.     Past Medical History:  Diagnosis Date   Anxiety    Depression    GERD (gastroesophageal reflux disease)    H/O hiatal hernia    Hyperlipemia    Hypertension    Osteoporosis    Seasonal allergies    Tremor, essential     Past Surgical History:  Procedure Laterality Date   BREAST CYST ASPIRATION  30+ years ago   CARDIAC VALVE REPLACEMENT     mitral valve replacement   COLONOSCOPY N/A 12/28/2013   Procedure: COLONOSCOPY;  Surgeon: Danie Binder, MD;  Location: [facility];  Service: Endoscopy;  Laterality: N/A;  10:45 AM   right leg has rod and screws       Family History  Problem Relation Age of Onset   Stroke Mother 87       brain stem stroke   Breast cancer Neg Hx   No bleeding or clotting disorders   Social History   Tobacco Use   Smoking status: Never   Smokeless tobacco: Never  Substance Use Topics   Alcohol use: Not Currently    Comment:  occasional   Drug use: Never     No Known Allergies  Current Outpatient Medications  Medication Sig Dispense Refill   aspirin 81 MG tablet Take 81 mg by mouth daily.     atorvastatin (LIPITOR) 40 MG tablet Take 1 tablet (40 mg total) by mouth daily. 30 tablet 1   clopidogrel (PLAVIX) 75 MG tablet Take 75 mg by mouth daily.     loratadine (CLARITIN) 10 MG tablet Take 10 mg by mouth daily.     metFORMIN (GLUCOPHAGE) 500 MG tablet Take 500 mg by mouth daily with supper.     Multiple Vitamin (MULTIVITAMIN) tablet Take 1 tablet by mouth daily.     NON FORMULARY Calcium 600 + D    BID     OZEMPIC, 0.25 OR 0.5 MG/DOSE, 2 MG/3ML SOPN SMARTSIG:0.25 Milligram(s) SUB-Q Once a Week     pantoprazole (PROTONIX) 40 MG tablet Take 40 mg by mouth daily.     propranolol ER (INDERAL LA) 160 MG SR capsule Take 160 mg by mouth daily.     sertraline (ZOLOFT) 100 MG tablet Take 100 mg by mouth daily.     No current facility-administered medications for this visit.      REVIEW OF SYSTEMS (  Negative unless checked)  Constitutional: []$ Weight loss  []$ Fever  []$ Chills Cardiac: []$ Chest pain   []$ Chest pressure   []$ Palpitations   []$ Shortness of breath when laying flat   []$ Shortness of breath at rest   []$ Shortness of breath with exertion. Vascular:  []$ Pain in legs with walking   []$ Pain in legs at rest   []$ Pain in legs when laying flat   []$ Claudication   []$ Pain in feet when walking  []$ Pain in feet at rest  []$ Pain in feet when laying flat   []$ History of DVT   []$ Phlebitis   []$ Swelling in legs   []$ Varicose veins   []$ Non-healing ulcers Pulmonary:   []$ Uses home oxygen   []$ Productive cough   []$ Hemoptysis   []$ Wheeze  []$ COPD   []$ Asthma Neurologic:  []$ Dizziness  []$ Blackouts   []$ Seizures   []$ History of stroke   []$ History of TIA  []$ Aphasia   [x]$ Temporary blindness   []$ Dysphagia   []$ Weakness or numbness in arms   []$ Weakness or numbness in legs Musculoskeletal:  [x]$ Arthritis   []$ Joint swelling   []$ Joint pain   []$ Low back  pain Hematologic:  []$ Easy bruising  []$ Easy bleeding   []$ Hypercoagulable state   []$ Anemic  []$ Hepatitis Gastrointestinal:  []$ Blood in stool   []$ Vomiting blood  [x]$ Gastroesophageal reflux/heartburn   []$ Abdominal pain Genitourinary:  []$ Chronic kidney disease   []$ Difficult urination  []$ Frequent urination  []$ Burning with urination   []$ Hematuria Skin:  []$ Rashes   []$ Ulcers   []$ Wounds Psychological:  [x]$ History of anxiety   [x]$  History of major depression.    Physical Exam BP 114/79   Pulse 87   Ht 5' 2"$  (1.575 m)   Wt 119 lb (54 kg)   BMI 21.77 kg/m  Gen:  WD/WN, NAD Head: [place]/AT, No temporalis wasting. Prominent temp pulse not noted. Ear/Nose/Throat: Hearing grossly intact, nares w/o erythema or drainage, oropharynx w/o Erythema/Exudate Eyes: Conjunctiva clear, sclera non-icteric  Neck: trachea midline.  No JVD.  Pulmonary:  Good air movement, respirations not labored, no use of accessory muscles  Cardiac: RRR, no JVD Vascular:  Vessel Right Left  Radial Palpable Palpable                                   Gastrointestinal:. No masses, surgical incisions, or scars. Musculoskeletal: M/S 5/5 throughout.  Extremities without ischemic changes.  No deformity or atrophy. No edema. Neurologic: Sensation grossly intact in extremities.  Symmetrical.  Speech is fluent. Motor exam as listed above. Psychiatric: Judgment intact, Mood & affect appropriate for pt's clinical situation. Dermatologic: No rashes or ulcers noted.  No cellulitis or open wounds.    Radiology CT ANGIO HEAD NECK W WO CM  Result Date: 11/23/2022 CLINICAL DATA:  67 year old female with partial vision loss on the left. Headaches and jaw pain. EXAM: CT ANGIOGRAPHY HEAD AND NECK TECHNIQUE: Multidetector CT imaging of the head and neck was performed using the standard protocol during bolus administration of intravenous contrast. Multiplanar CT image reconstructions and MIPs were obtained to evaluate the vascular anatomy.  Carotid stenosis measurements (when applicable) are obtained utilizing NASCET criteria, using the distal internal carotid diameter as the denominator. RADIATION DOSE REDUCTION: This exam was performed according to the departmental dose-optimization program which includes automated exposure control, adjustment of the mA and/or kV according to patient size and/or use of iterative reconstruction technique. CONTRAST:  17m OMNIPAQUE IOHEXOL 350 MG/ML SOLN COMPARISON:  Brain MRI and orbit MRI 03/06/2022, head CT 03/05/2022. FINDINGS: CT  HEAD Brain: Stable cerebral volume. No midline shift, mass effect, or evidence of intracranial mass lesion. No acute intracranial hemorrhage identified. No ventriculomegaly. Patchy bilateral cerebral white matter hypodensity is moderate for age and appears similar to MRI signal changes last year. No cortically based acute infarct identified. No cortical encephalomalacia identified. Calvarium and skull base: No acute osseous abnormality identified. Hyperostosis of the calvarium, normal variant Paranasal sinuses: Visualized paranasal sinuses and mastoids are clear. Orbits: Symmetric, negative orbits soft tissues. Visualized scalp soft tissues are within normal limits. CTA NECK Skeleton: Mandible intact. TMJ normally located. Severe right TMJ degeneration with osteophytosis and subchondral cysts at the right mandible condyle. Widespread cervical spine degeneration, bulky disc and endplate degeneration. Previous sternotomy. No acute osseous abnormality identified. Upper chest: Visible major airways and lungs appear negative. Visible central pulmonary arteries are patent. No superior mediastinal lymphadenopathy. Other neck: No acute finding. Aortic arch: Prior sternotomy but no obvious CABG is visible. 3 vessel arch configuration with mild arch tortuosity and atherosclerosis. Right carotid system: Brachiocephalic artery and right CCA origin are normal. Mildly tortuous right CCA. Capacious  carotid bifurcation and no plaque or stenosis to the skull base. Left carotid system: Similar mild tortuosity and capacious carotid bifurcation with no plaque or stenosis. Vertebral arteries: Minimal proximal right subclavian artery plaque without stenosis. Normal right vertebral artery origin. Tortuous right V1 segment. Right vertebral is mildly dominant and is patent to the skull base with no significant plaque or stenosis. Normal proximal left subclavian artery and left vertebral artery origin. Tortuous left V1 segment. Mildly non dominant left vertebral artery is patent to the skull base with no significant plaque or stenosis. CTA HEAD Posterior circulation: Fairly codominant distal vertebral arteries with no plaque or stenosis to the vertebrobasilar junction. Patent PICA and AICA origins. Patent basilar artery with mild tortuosity but no stenosis. Normal SCA origins. Patent right PCA origin. Fetal left PCA origin with diminutive or absent P1 segment. Right posterior communicating artery is also present. Tortuous left P2 segment but bilateral PCA branches are patent with no significant stenosis. Anterior circulation: Both ICA siphons are patent. On the left there is no siphon plaque or stenosis, with normal left posterior communicating artery origin. And patent left ophthalmic artery origin (series 15, image 104). Right ICA siphon is also normal without stenosis. Patent carotid termini. Patent MCA and ACA origins. Mild M1 and A1 tortuosity bilaterally. Normal anterior communicating artery. Bilateral ACA branches are within normal limits. Left MCA M1 segment and trifurcation are patent without stenosis. Right MCA M1 segment and trifurcation are patent without stenosis. Bilateral MCA branches are within normal limits. Venous sinuses: Early contrast timing, grossly patent. Anatomic variants: Mildly dominant right vertebral artery. Fetal left PCA origin. Review of the MIP images confirms the above findings  IMPRESSION: 1. Negative CTA Head and Neck; mild generalized arterial tortuosity but no significant atherosclerosis or stenosis. 2. Stable CT appearance of the brain since last year. Moderate for age cerebral white matter changes most commonly due to chronic small vessel disease. 3. 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 Electronically Signed   By: Genevie Ann M.D.   On: 11/23/2022 06:37    Labs Recent Results (from the past 2160 hour(s))  CBC     Status: None   Collection Time: 11/14/22 10:58 AM  Result Value Ref Range   WBC 5.7 4.0 - 10.5 K/uL   RBC 3.96 3.87 - 5.11 MIL/uL   Hemoglobin 12.7 12.0 - 15.0 g/dL   HCT 38.6  36.0 - 46.0 %   MCV 97.5 80.0 - 100.0 fL   MCH 32.1 26.0 - 34.0 pg   MCHC 32.9 30.0 - 36.0 g/dL   RDW 13.1 11.5 - 15.5 %   Platelets 261 150 - 400 K/uL   nRBC 0.0 0.0 - 0.2 %    Comment: Performed at [facility], [address] [place], [place] [address]  C-reactive protein     Status: Abnormal   Collection Time: 11/14/22 10:58 AM  Result Value Ref Range   CRP 1.0 (H) <1.0 mg/dL    Comment: Performed at [facility] [address] [place], [place] [address]  Sedimentation rate     Status: None   Collection Time: 11/14/22 10:58 AM  Result Value Ref Range   Sed Rate 28 0 - 30 mm/hr    Comment: Performed at [facility], [address] [place], [place] [address]    Assessment/Plan:  Stroke-like symptoms The patient has symptoms concerning for temporal arteritis and we are asked about doing a temporal artery biopsy.  This is certainly reasonable.  There is no way to definitively rule out temporal arteritis without biopsy.  We will plan on the left temporal artery biopsy sometime next week.  Risks and benefits are discussed.  Uncontrolled type 2 diabetes mellitus with hyperglycemia, without long-term current use of insulin (HCC) blood glucose control important in reducing the progression of atherosclerotic  disease. Also, involved in wound healing. On appropriate medications.   Essential hypertension blood pressure control important in reducing the progression of atherosclerotic disease. On appropriate oral medications.      Leotis Pain 12/21/2022, 11:18 AM   This note was created with Dragon medical transcription system.  Any errors from dictation are unintentional.

## 2022-12-25 ENCOUNTER — Other Ambulatory Visit: Payer: Self-pay

## 2022-12-25 ENCOUNTER — Other Ambulatory Visit (INDEPENDENT_AMBULATORY_CARE_PROVIDER_SITE_OTHER): Payer: Self-pay | Admitting: Nurse Practitioner

## 2022-12-25 ENCOUNTER — Encounter
Admission: RE | Admit: 2022-12-25 | Discharge: 2022-12-25 | Disposition: A | Discharge status: Home / Self Care | Payer: Medicare PPO | Source: Ambulatory Visit | Attending: Vascular Surgery | Admitting: Vascular Surgery

## 2022-12-25 DIAGNOSIS — Z01818 Encounter for other preprocedural examination: Secondary | ICD-10-CM

## 2022-12-25 DIAGNOSIS — Z01812 Encounter for preprocedural laboratory examination: Secondary | ICD-10-CM

## 2022-12-25 HISTORY — DX: Pneumonia, unspecified organism: J18.9

## 2022-12-25 HISTORY — DX: Headache, unspecified: R51.9

## 2022-12-25 HISTORY — DX: Unspecified osteoarthritis, unspecified site: M19.90

## 2022-12-25 HISTORY — DX: Myoneural disorder, unspecified: G70.9

## 2022-12-25 HISTORY — DX: Type 2 diabetes mellitus without complications: E11.9

## 2022-12-25 HISTORY — DX: Cerebral infarction, unspecified: I63.9

## 2022-12-25 NOTE — Patient Instructions (Addendum)
Your procedure is scheduled on: 12/27/22 - Thursday Report to the Registration Desk on the 1st floor of the [facility]. To find out your arrival time, please call [phone] between 1PM - 3PM on: 12/26/22 - Wednesday If your arrival time is 6:00 am, do not arrive before that time as the [facility] entrance doors do not open until 6:00 am.  REMEMBER: Instructions that are not followed completely may result in serious medical risk, up to and including death; or upon the discretion of your surgeon and anesthesiologist your surgery may need to be rescheduled.  Do not eat food or drink any liquids after midnight the night before surgery.  No gum chewing or hard candies.  One week prior to surgery: Stop Anti-inflammatories (NSAIDS) such as Advil, Aleve, Ibuprofen, Motrin, Naproxen, Naprosyn and Aspirin based products such as Excedrin, Goody's Powder, BC Powder.  Stop ANY OVER THE COUNTER supplements until after surgery.Calcium 600 + D , Multiple Vitamin (MULTIVITAMIN) .  You may take Tylenol if needed for pain up until the day of surgery.  Continue taking all prescribed medications with the exception of the following:  - Hold metFORMIN (GLUCOPHAGE) beginning 12/25/22. - HOLD OZEMPIC for 7 days prior to your surgery.    TAKE ONLY THESE MEDICATIONS THE MORNING OF SURGERY WITH A SIP OF WATER:  1. pantoprazole (PROTONIX) - (take one the night before and one on the morning of surgery - helps to prevent nausea after surgery.) 2. propranolol ER (INDERAL LA)    No Alcohol for 24 hours before or after surgery.  No Smoking including e-cigarettes for 24 hours before surgery.  No chewable tobacco products for at least 6 hours before surgery.  No nicotine patches on the day of surgery.  Do not use any "recreational" drugs for at least a week (preferably 2 weeks) before your surgery.  Please be advised that the combination of cocaine and anesthesia may have negative outcomes, up to and  including death. If you test positive for cocaine, your surgery will be cancelled.  On the morning of surgery brush your teeth with toothpaste and water, you may rinse your mouth with mouthwash if you wish. Do not swallow any toothpaste or mouthwash.  Do not wear jewelry, make-up, hairpins, clips or nail polish.  Do not wear lotions, powders, or perfumes.   Do not shave body hair from the neck down 48 hours before surgery.  Contact lenses, hearing aids and dentures may not be worn into surgery.  Do not bring valuables to the hospital. [facility] is not responsible for any missing/lost belongings or valuables.   Notify your doctor if there is any change in your medical condition (cold, fever, infection).  Wear comfortable clothing (specific to your surgery type) to the hospital.  After surgery, you can help prevent lung complications by doing breathing exercises.  Take deep breaths and cough every 1-2 hours. Your doctor may order a device called an Incentive Spirometer to help you take deep breaths. When coughing or sneezing, hold a pillow firmly against your incision with both hands. This is called "splinting." Doing this helps protect your incision. It also decreases belly discomfort.  If you are being admitted to the hospital overnight, leave your suitcase in the car. After surgery it may be brought to your room.  In case of increased patient census, it may be necessary for you, the patient, to continue your postoperative care in the Same Day Surgery department.  If you are being discharged the day  of surgery, you will not be allowed to drive home. You will need a responsible individual to drive you home and stay with you for 24 hours after surgery.   If you are taking public transportation, you will need to have a responsible individual with you.  Please call the [facility] Dept. at [phone] if you have any questions about these instructions.  Surgery  Visitation Policy:  Patients undergoing a surgery or procedure may have two family members or support persons with them as long as the person is not COVID-19 positive or experiencing its symptoms.   Inpatient Visitation:    Visiting hours are 7 a.m. to 8 p.m. Up to four visitors are allowed at one time in a patient room. The visitors may rotate out with other people during the day. One designated support person (adult) may remain overnight.  Due to an increase in RSV and influenza rates and associated hospitalizations, children ages 19 and under will not be able to visit patients in [facility]. Masks continue to be strongly recommended.

## 2022-12-26 ENCOUNTER — Encounter
Admission: RE | Admit: 2022-12-26 | Discharge: 2022-12-26 | Disposition: A | Discharge status: Home / Self Care | Payer: Medicare PPO | Source: Ambulatory Visit | Attending: Vascular Surgery | Admitting: Vascular Surgery

## 2022-12-26 DIAGNOSIS — Z01818 Encounter for other preprocedural examination: Secondary | ICD-10-CM | POA: Insufficient documentation

## 2022-12-26 DIAGNOSIS — Z01812 Encounter for preprocedural laboratory examination: Secondary | ICD-10-CM

## 2022-12-26 LAB — CBC WITH DIFFERENTIAL/PLATELET
Abs Immature Granulocytes: 0.02 10*3/uL (ref 0.00–0.07)
Basophils Absolute: 0 10*3/uL (ref 0.0–0.1)
Basophils Relative: 1 %
Eosinophils Absolute: 0.1 10*3/uL (ref 0.0–0.5)
Eosinophils Relative: 2 %
HCT: 36.6 % (ref 36.0–46.0)
Hemoglobin: 12.3 g/dL (ref 12.0–15.0)
Immature Granulocytes: 0 %
Lymphocytes Relative: 26 %
Lymphs Abs: 1.6 10*3/uL (ref 0.7–4.0)
MCH: 32.3 pg (ref 26.0–34.0)
MCHC: 33.6 g/dL (ref 30.0–36.0)
MCV: 96.1 fL (ref 80.0–100.0)
Monocytes Absolute: 0.5 10*3/uL (ref 0.1–1.0)
Monocytes Relative: 8 %
Neutro Abs: 3.9 10*3/uL (ref 1.7–7.7)
Neutrophils Relative %: 63 %
Platelets: 265 10*3/uL (ref 150–400)
RBC: 3.81 MIL/uL — ABNORMAL LOW (ref 3.87–5.11)
RDW: 12.8 % (ref 11.5–15.5)
WBC: 6.1 10*3/uL (ref 4.0–10.5)
nRBC: 0 % (ref 0.0–0.2)

## 2022-12-26 LAB — TYPE AND SCREEN
ABO/RH(D): O POS
Antibody Screen: NEGATIVE

## 2022-12-26 MED ORDER — CHLORHEXIDINE GLUCONATE CLOTH 2 % EX PADS: 6.0000 | MEDICATED_PAD | Freq: Once | CUTANEOUS | Status: DC

## 2022-12-26 MED ORDER — CEFAZOLIN SODIUM-DEXTROSE 2-4 GM/100ML-% IV SOLN
2.0000 g | INTRAVENOUS | Status: AC
  Administered 2022-12-27: 2 g via INTRAVENOUS

## 2022-12-26 MED ORDER — CHLORHEXIDINE GLUCONATE 0.12 % MT SOLN: 15.0000 mL | Freq: Once | OROMUCOSAL | Status: AC

## 2022-12-26 MED ORDER — ORAL CARE MOUTH RINSE: 15.0000 mL | Freq: Once | OROMUCOSAL | Status: AC

## 2022-12-26 MED ORDER — LACTATED RINGERS IV SOLN: INTRAVENOUS | Status: DC

## 2022-12-26 MED ORDER — SODIUM CHLORIDE 0.9 % IV SOLN: INTRAVENOUS | Status: DC

## 2022-12-27 ENCOUNTER — Ambulatory Visit: Payer: Medicare PPO | Admitting: Urgent Care

## 2022-12-27 ENCOUNTER — Encounter
Admission: RE | Disposition: A | Discharge status: Home / Self Care | Payer: Self-pay | Source: Home / Self Care | Attending: Vascular Surgery

## 2022-12-27 ENCOUNTER — Encounter: Payer: Self-pay | Admitting: Vascular Surgery

## 2022-12-27 ENCOUNTER — Other Ambulatory Visit: Payer: Self-pay

## 2022-12-27 ENCOUNTER — Ambulatory Visit
Admission: RE | Admit: 2022-12-27 | Discharge: 2022-12-27 | Disposition: A | Discharge status: Home / Self Care | Payer: Medicare PPO | Attending: Vascular Surgery | Admitting: Vascular Surgery

## 2022-12-27 DIAGNOSIS — E1165 Type 2 diabetes mellitus with hyperglycemia: Secondary | ICD-10-CM | POA: Insufficient documentation

## 2022-12-27 DIAGNOSIS — I10 Essential (primary) hypertension: Secondary | ICD-10-CM | POA: Insufficient documentation

## 2022-12-27 DIAGNOSIS — F32A Depression, unspecified: Secondary | ICD-10-CM | POA: Insufficient documentation

## 2022-12-27 DIAGNOSIS — K219 Gastro-esophageal reflux disease without esophagitis: Secondary | ICD-10-CM | POA: Insufficient documentation

## 2022-12-27 DIAGNOSIS — Z8673 Personal history of transient ischemic attack (TIA), and cerebral infarction without residual deficits: Secondary | ICD-10-CM | POA: Diagnosis not present

## 2022-12-27 DIAGNOSIS — Z7984 Long term (current) use of oral hypoglycemic drugs: Secondary | ICD-10-CM | POA: Diagnosis not present

## 2022-12-27 DIAGNOSIS — K449 Diaphragmatic hernia without obstruction or gangrene: Secondary | ICD-10-CM | POA: Insufficient documentation

## 2022-12-27 DIAGNOSIS — H539 Unspecified visual disturbance: Secondary | ICD-10-CM | POA: Diagnosis not present

## 2022-12-27 HISTORY — PX: ARTERY BIOPSY: SHX891

## 2022-12-27 LAB — GLUCOSE, CAPILLARY
Glucose-Capillary: 153 mg/dL — ABNORMAL HIGH (ref 70–99)
Glucose-Capillary: 141 mg/dL — ABNORMAL HIGH (ref 70–99)

## 2022-12-27 LAB — ABO/RH: ABO/RH(D): O POS

## 2022-12-27 SURGERY — BIOPSY TEMPORAL ARTERY
Anesthesia: Monitor Anesthesia Care | Site: Face | Laterality: Left

## 2022-12-27 MED ORDER — GLYCOPYRROLATE 0.2 MG/ML IJ SOLN
INTRAMUSCULAR | Status: DC | PRN
  Administered 2022-12-27: .2 mg via INTRAVENOUS

## 2022-12-27 MED ORDER — FENTANYL CITRATE (PF) 100 MCG/2ML IJ SOLN: 25.0000 ug | INTRAMUSCULAR | Status: DC | PRN

## 2022-12-27 MED ORDER — LIDOCAINE HCL (PF) 1 % IJ SOLN
INTRAMUSCULAR | Status: DC | PRN
  Administered 2022-12-27: 5 mL

## 2022-12-27 MED ORDER — PROPOFOL 1000 MG/100ML IV EMUL
INTRAVENOUS | Status: AC
  Filled 2022-12-27: qty 200

## 2022-12-27 MED ORDER — CHLORHEXIDINE GLUCONATE 0.12 % MT SOLN
OROMUCOSAL | Status: AC
  Administered 2022-12-27: 15 mL via OROMUCOSAL
  Filled 2022-12-27: qty 15

## 2022-12-27 MED ORDER — 0.9 % SODIUM CHLORIDE (POUR BTL) OPTIME
TOPICAL | Status: DC | PRN
  Administered 2022-12-27: 500 mL

## 2022-12-27 MED ORDER — ACETAMINOPHEN 10 MG/ML IV SOLN
INTRAVENOUS | Status: AC
  Filled 2022-12-27: qty 100

## 2022-12-27 MED ORDER — PROPOFOL 10 MG/ML IV BOLUS
INTRAVENOUS | Status: DC | PRN
  Administered 2022-12-27: 10 mg via INTRAVENOUS
  Administered 2022-12-27 (×2): 20 mg via INTRAVENOUS

## 2022-12-27 MED ORDER — CEFAZOLIN SODIUM-DEXTROSE 2-4 GM/100ML-% IV SOLN
INTRAVENOUS | Status: AC
  Filled 2022-12-27: qty 100

## 2022-12-27 MED ORDER — FENTANYL CITRATE (PF) 100 MCG/2ML IJ SOLN
INTRAMUSCULAR | Status: DC | PRN
  Administered 2022-12-27 (×2): 25 ug via INTRAVENOUS

## 2022-12-27 MED ORDER — EPHEDRINE SULFATE (PRESSORS) 50 MG/ML IJ SOLN
INTRAMUSCULAR | Status: DC | PRN
  Administered 2022-12-27 (×2): 5 mg via INTRAVENOUS

## 2022-12-27 MED ORDER — ACETAMINOPHEN 10 MG/ML IV SOLN
INTRAVENOUS | Status: DC | PRN
  Administered 2022-12-27: 1000 mg via INTRAVENOUS

## 2022-12-27 MED ORDER — MIDAZOLAM HCL 2 MG/2ML IJ SOLN
INTRAMUSCULAR | Status: AC
  Filled 2022-12-27: qty 2

## 2022-12-27 MED ORDER — FENTANYL CITRATE (PF) 100 MCG/2ML IJ SOLN
INTRAMUSCULAR | Status: AC
  Filled 2022-12-27: qty 2

## 2022-12-27 MED ORDER — MIDAZOLAM HCL 2 MG/2ML IJ SOLN
INTRAMUSCULAR | Status: DC | PRN
  Administered 2022-12-27: 2 mg via INTRAVENOUS

## 2022-12-27 MED ORDER — PHENYLEPHRINE 80 MCG/ML (10ML) SYRINGE FOR IV PUSH (FOR BLOOD PRESSURE SUPPORT)
PREFILLED_SYRINGE | INTRAVENOUS | Status: DC | PRN
  Administered 2022-12-27: 160 ug via INTRAVENOUS

## 2022-12-27 MED ORDER — ONDANSETRON HCL 4 MG/2ML IJ SOLN
INTRAMUSCULAR | Status: DC | PRN
  Administered 2022-12-27 (×2): 4 mg via INTRAVENOUS

## 2022-12-27 MED ORDER — ONDANSETRON HCL 4 MG/2ML IJ SOLN: 4.0000 mg | Freq: Once | INTRAMUSCULAR | Status: DC | PRN

## 2022-12-27 MED ORDER — DEXAMETHASONE SODIUM PHOSPHATE 10 MG/ML IJ SOLN
INTRAMUSCULAR | Status: DC | PRN
  Administered 2022-12-27: 5 mg via INTRAVENOUS

## 2022-12-27 MED ORDER — LIDOCAINE HCL (PF) 1 % IJ SOLN
INTRAMUSCULAR | Status: AC
  Filled 2022-12-27: qty 30

## 2022-12-27 MED ORDER — PROPOFOL 500 MG/50ML IV EMUL
INTRAVENOUS | Status: DC | PRN
  Administered 2022-12-27: 125 ug/kg/min via INTRAVENOUS

## 2022-12-27 SURGICAL SUPPLY — 43 items
BLADE SURG 15 STRL LF DISP TIS (BLADE) ×1 IMPLANT
BLADE SURG 15 STRL SS (BLADE) ×1
BLADE SURG SZ11 CARB STEEL (BLADE) ×1 IMPLANT
CNTNR URN SCR LID CUP LEK RST (MISCELLANEOUS) IMPLANT
CONT SPEC 4OZ STRL OR WHT (MISCELLANEOUS)
COTTON BALL STERILE (GAUZE/BANDAGES/DRESSINGS) ×1
COTTON BALL STERILE 4 PK (GAUZE/BANDAGES/DRESSINGS) ×1 IMPLANT
DERMABOND ADVANCED .7 DNX12 (GAUZE/BANDAGES/DRESSINGS) ×1 IMPLANT
DRAPE LAPAROTOMY 77X122 PED (DRAPES) ×1 IMPLANT
DRSG TELFA 3X4 N-ADH STERILE (GAUZE/BANDAGES/DRESSINGS) ×1 IMPLANT
ELECT CAUTERY BLADE 6.4 (BLADE) ×1 IMPLANT
ELECT REM PT RETURN 9FT ADLT (ELECTROSURGICAL) ×1
ELECTRODE REM PT RTRN 9FT ADLT (ELECTROSURGICAL) ×1 IMPLANT
GAUZE 4X4 16PLY ~~LOC~~+RFID DBL (SPONGE) ×1 IMPLANT
GLOVE BIO SURGEON STRL SZ7 (GLOVE) ×2 IMPLANT
GOWN STRL REUS W/ TWL LRG LVL3 (GOWN DISPOSABLE) ×2 IMPLANT
GOWN STRL REUS W/ TWL XL LVL3 (GOWN DISPOSABLE) ×1 IMPLANT
GOWN STRL REUS W/TWL LRG LVL3 (GOWN DISPOSABLE) ×2
GOWN STRL REUS W/TWL XL LVL3 (GOWN DISPOSABLE) ×1
KIT TURNOVER KIT A (KITS) ×1 IMPLANT
LABEL OR SOLS (LABEL) ×1 IMPLANT
MANIFOLD NEPTUNE II (INSTRUMENTS) ×1 IMPLANT
NDL HYPO 25X1 1.5 SAFETY (NEEDLE) IMPLANT
NEEDLE HYPO 25X1 1.5 SAFETY (NEEDLE) ×1 IMPLANT
NS IRRIG 500ML POUR BTL (IV SOLUTION) ×1 IMPLANT
PACK BASIN MINOR ARMC (MISCELLANEOUS) ×1 IMPLANT
SOL PREP PVP 2OZ (MISCELLANEOUS) ×1
SOLUTION PREP PVP 2OZ (MISCELLANEOUS) ×1 IMPLANT
SUCTION FRAZIER HANDLE 10FR (MISCELLANEOUS) ×1
SUCTION TUBE FRAZIER 10FR DISP (MISCELLANEOUS) ×1 IMPLANT
SUT MNCRL AB 4-0 PS2 18 (SUTURE) ×1 IMPLANT
SUT SILK 2 0 (SUTURE) ×1
SUT SILK 2-0 18XBRD TIE 12 (SUTURE) ×1 IMPLANT
SUT SILK 3 0 (SUTURE) ×1
SUT SILK 3-0 18XBRD TIE 12 (SUTURE) ×1 IMPLANT
SUT SILK 4 0 (SUTURE) ×1
SUT SILK 4-0 18XBRD TIE 12 (SUTURE) ×1 IMPLANT
SUT VIC AB 3-0 SH 27 (SUTURE) ×1
SUT VIC AB 3-0 SH 27X BRD (SUTURE) ×1 IMPLANT
SYR 10ML LL (SYRINGE) IMPLANT
SYR BULB IRRIG 60ML STRL (SYRINGE) ×1 IMPLANT
TRAP FLUID SMOKE EVACUATOR (MISCELLANEOUS) ×1 IMPLANT
WATER STERILE IRR 500ML POUR (IV SOLUTION) ×1 IMPLANT

## 2022-12-27 NOTE — Anesthesia Preprocedure Evaluation (Signed)
Anesthesia Evaluation  Patient identified by MRN, date of birth, ID band Patient awake    Reviewed: Allergy & Precautions, NPO status , Patient's Chart, lab work & pertinent test results  Airway Mallampati: II  TM Distance: >3 FB Neck ROM: full    Dental  (+) Teeth Intact   Pulmonary neg pulmonary ROS   Pulmonary exam normal breath sounds clear to auscultation       Cardiovascular Exercise Tolerance: Good hypertension, Pt. on medications negative cardio ROS Normal cardiovascular exam Rhythm:Regular Rate:Normal     Neuro/Psych  Headaches   Depression    Bilateral Optic Nerve vascular incidents CVA negative neurological ROS  negative psych ROS   GI/Hepatic negative GI ROS, Neg liver ROS, hiatal hernia,GERD  Medicated,,  Endo/Other  negative endocrine ROSdiabetes, Well Controlled, Type 2, Oral Hypoglycemic Agents    Renal/GU      Musculoskeletal   Abdominal Normal abdominal exam  (+)   Peds negative pediatric ROS (+)  Hematology negative hematology ROS (+)   Anesthesia Other Findings Past Medical History: No date: Anxiety No date: Arthritis No date: Depression No date: Diabetes mellitus without complication (HCC) No date: GERD (gastroesophageal reflux disease) No date: H/O hiatal hernia No date: Headache No date: Hyperlipemia No date: Hypertension No date: Neuromuscular disorder ([place]) No date: Osteoporosis No date: Pneumonia No date: Seasonal allergies No date: Stroke [facility])     Comment:  x 2 Optic nevre cvas No date: Tremor, essential  Past Surgical History: 30+ years ago: BREAST CYST ASPIRATION No date: CARDIAC VALVE REPLACEMENT     Comment:  mitral valve replacement 12/28/2013: COLONOSCOPY; N/A     Comment:  Procedure: COLONOSCOPY;  Surgeon: Danie Binder, MD;                Location[facility];  Service: Endoscopy;                Laterality: N/A;  10:45 AM No date: FRACTURE SURGERY      Comment:  tib - fib right leg No date: right leg has rod and screws No date: TUBAL LIGATION  BMI    Body Mass Index: 21.76 kg/m      Reproductive/Obstetrics negative OB ROS                             Anesthesia Physical Anesthesia Plan  ASA: 3  Anesthesia Plan: MAC   Post-op Pain Management:    Induction: Intravenous  PONV Risk Score and Plan:   Airway Management Planned: Natural Airway and Nasal Cannula  Additional Equipment:   Intra-op Plan:   Post-operative Plan:   Informed Consent: I have reviewed the patients History and Physical, chart, labs and discussed the procedure including the risks, benefits and alternatives for the proposed anesthesia with the patient or authorized representative who has indicated his/her understanding and acceptance.       Plan Discussed with: CRNA and Surgeon  Anesthesia Plan Comments:        Anesthesia Quick Evaluation

## 2022-12-27 NOTE — Anesthesia Postprocedure Evaluation (Signed)
Anesthesia Post Note  Patient: Kathleen Frazier  Procedure(s) Performed: BIOPSY TEMPORAL ARTERY (Left: Face)  Patient location during evaluation: PACU Anesthesia Type: MAC Level of consciousness: awake and awake and alert Pain management: pain level controlled Vital Signs Assessment: post-procedure vital signs reviewed and stable Respiratory status: spontaneous breathing Cardiovascular status: stable Anesthetic complications: no   No notable events documented.   Last Vitals:  Vitals:   12/27/22 0845 12/27/22 0856  BP: 117/72 128/69  Pulse: 83 84  Resp: 16 18  Temp: 36.8 C (!) 36.1 C  SpO2: 95% 96%    Last Pain:  Vitals:   12/27/22 0856  TempSrc: Temporal  PainSc: 0-No pain                 VAN STAVEREN,Keithon Mccoin

## 2022-12-27 NOTE — Op Note (Signed)
        OPERATIVE NOTE   PRE-OPERATIVE DIAGNOSIS: suspected temporal arteritis  POST-OPERATIVE DIAGNOSIS: Same as above  PROCEDURE: 1.   Left temporal artery biopsy  SURGEON: Leotis Pain, MD  ASSISTANT(S): none  ANESTHESIA: MAC  ESTIMATED BLOOD LOSS: Minimal  FINDING(S): 1.  none  SPECIMEN(S):  Left superficial temporal artery sent to pathology  INDICATIONS:   Patient is a 67 y.o. female who presents with visual changes and pain concerning for temporal arteritis.  We were consulted by Ophthalmology for consideration for temporal artery biopsy. Risks and benefits were discussed and he was agreeable to proceed.  DESCRIPTION: After obtaining full informed written consent, the patient was brought back to the operating room and placed supine upon the operating table.  The patient received IV antibiotics prior to induction.  After obtaining adequate anesthesia, the patient was prepped and draped in the standard fashion. The area in front of his left ear was anesthetized copiously with a solution of 1% lidocaine and half percent Marcaine without epinephrine. I then made an incision just in front of the left ear overlying the palpable pulse. I then dissected down through the subcutaneous tissues and identified the superficial temporal artery. This was dissected out over a several centimeters and branches were ligated and divided between silk ties. Care was used to avoid electrocautery around the artery. I then clamped the artery proximally and distally and transected the artery. The specimen was then sent to pathology. The proximal and distal artery were ligated with 3-0 silk ties. Hemostasis was achieved. The wound was then closed with a series of interrupted 3-0 Vicryl's and the skin was closed with a 4-0 Monocryl. Sterile dressing was placed. The patient was taken to the recovery room in stable condition having tolerated the procedure well.  COMPLICATIONS: None  CONDITION: Stable   Leotis Pain 12/27/2022 8:18 AM  This note was created with Dragon Medical transcription system. Any errors in dictation are purely unintentional.

## 2022-12-27 NOTE — Interval H&P Note (Signed)
History and Physical Interval Note:  12/27/2022 7:16 AM  Kathleen Frazier  has presented today for surgery, with the diagnosis of TEMPORAL ARTERITIS.  The various methods of treatment have been discussed with the patient and family. After consideration of risks, benefits and other options for treatment, the patient has consented to  Procedure(s): BIOPSY TEMPORAL ARTERY (Left) as a surgical intervention.  The patient's history has been reviewed, patient examined, no change in status, stable for surgery.  I have reviewed the patient's chart and labs.  Questions were answered to the patient's satisfaction.     Leotis Pain

## 2022-12-27 NOTE — Discharge Instructions (Signed)

## 2022-12-27 NOTE — Transfer of Care (Signed)
  Immediate Anesthesia Transfer of Care Note  Patient: Kathleen Frazier  Procedure(s) Performed: BIOPSY TEMPORAL ARTERY (Left: Face)  Patient Location: PACU  Anesthesia Type:General  Level of Consciousness: awake, drowsy, and patient cooperative  Airway & Oxygen Therapy: Patient Spontanous Breathing and Patient connected to face mask oxygen  Post-op Assessment: Report given to RN and Post -op Vital signs reviewed and stable  Post vital signs: Reviewed and stable  Last Vitals:  Vitals Value Taken Time  BP 115/71 12/27/22 0823  Temp    Pulse 82 12/27/22 0824  Resp 17 12/27/22 0824  SpO2 99 % 12/27/22 0824  Vitals shown include unvalidated device data.  Last Pain:  Vitals:   12/27/22 0616  TempSrc: Oral  PainSc: 0-No pain         Complications: No notable events documented.

## 2022-12-28 LAB — SURGICAL PATHOLOGY

## 2023-01-01 ENCOUNTER — Telehealth (INDEPENDENT_AMBULATORY_CARE_PROVIDER_SITE_OTHER): Payer: Self-pay

## 2023-01-01 NOTE — Telephone Encounter (Signed)
Patient called about tape that is still on her temple from her temporal artery biopsy on 12/27/22 with Dr. Lucky Cowboy. Patient wanted to know if she could take the tape off, she lives by herself. I looked on the discharge summary and it says that if no drainage the bandage can be removed. This information was given to the patient and I advised to have someone look at the site as well since she has some sight challenges.

## 2023-01-02 ENCOUNTER — Encounter (INDEPENDENT_AMBULATORY_CARE_PROVIDER_SITE_OTHER): Payer: Self-pay | Admitting: Vascular Surgery

## 2023-01-10 ENCOUNTER — Encounter (INDEPENDENT_AMBULATORY_CARE_PROVIDER_SITE_OTHER): Payer: Self-pay | Admitting: Nurse Practitioner

## 2023-01-10 ENCOUNTER — Ambulatory Visit (INDEPENDENT_AMBULATORY_CARE_PROVIDER_SITE_OTHER): Payer: Medicare PPO | Admitting: Nurse Practitioner

## 2023-01-10 VITALS — BP 136/93 | HR 89 | Resp 18 | Ht 62.0 in | Wt 122.2 lb

## 2023-01-10 DIAGNOSIS — I10 Essential (primary) hypertension: Secondary | ICD-10-CM

## 2023-01-10 DIAGNOSIS — E782 Mixed hyperlipidemia: Secondary | ICD-10-CM

## 2023-01-10 DIAGNOSIS — R2990 Unspecified symptoms and signs involving the nervous system: Secondary | ICD-10-CM

## 2023-01-28 ENCOUNTER — Encounter (INDEPENDENT_AMBULATORY_CARE_PROVIDER_SITE_OTHER): Payer: Self-pay | Admitting: Nurse Practitioner

## 2023-01-29 NOTE — Progress Notes (Signed)
Subjective:    Patient ID: Kathleen Frazier, female    DOB: [birth year], 67 y.o.   MRN: [id number] Chief Complaint  Patient presents with   Follow-up    2 week follow up wound recheck    Kathleen Frazier is a 67 y.o. female.   She had an episode of visual changes about 6 months ago affecting the right eye.  This was associated with some facial droop and Bell's palsy type symptoms.  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  Her visual changes on the left or not entirely resolved but seem to be evolving.  She had a CRP and a sed rate that were at the upper limits of normal to slightly elevated.  Given her symptoms, there was concern for temporal arteritis and she is referred for further evaluation and treatment.  The patient underwent temporal artery biopsy and it was noted that there was no evidence of temporal arteritis.    Review of Systems  Skin:  Positive for wound.  Neurological:  Positive for tremors.  All other systems reviewed and are negative.      Objective:   Physical Exam Vitals reviewed.  HENT:     Head: Normocephalic.  Cardiovascular:     Rate and Rhythm: Normal rate.     Pulses: Normal pulses.  Pulmonary:     Effort: Pulmonary effort is normal.  Skin:    General: Skin is warm and dry.  Neurological:     Mental Status: She is alert and oriented to person, place, and time.  Psychiatric:        Mood and Affect: Mood normal.        Behavior: Behavior normal.        Thought Content: Thought content normal.        Judgment: Judgment normal.     BP (!) 136/93 (BP Location: Right Arm)   Pulse 89   Resp 18   Ht 5\' 2"  (1.575 m)   Wt 122 lb 3.2 oz (55.4 kg)   BMI 22.35 kg/m   Past Medical History:  Diagnosis Date   Anxiety    Arthritis    Depression    Diabetes mellitus without complication (HCC)    GERD (gastroesophageal reflux disease)    H/O hiatal hernia    Headache    Hyperlipemia     Hypertension    Neuromuscular disorder (HCC)    Osteoporosis    Pneumonia    Seasonal allergies    Stroke (HCC)    x 2 Optic nevre cvas   Tremor, essential     Social History   Socioeconomic History   Marital status: Divorced    Spouse name: Not on file   Number of children: Not on file   Years of education: Not on file   Highest education level: Not on file  Occupational History   Not on file  Tobacco Use   Smoking status: Never   Smokeless tobacco: Never  Substance and Sexual Activity   Alcohol use: Yes    Comment: occasional   Drug use: Never   Sexual activity: Not Currently  Other Topics Concern   Not on file  Social History Narrative   Lives alone   Social Determinants of Health   Financial Resource Strain: Not on file  Food Insecurity: Not on file  Transportation Needs: Not on file  Physical Activity: Not on file  Stress:  Not on file  Social Connections: Not on file  Intimate Partner Violence: Not on file    Past Surgical History:  Procedure Laterality Date   ARTERY BIOPSY Left 12/27/2022   Procedure: BIOPSY TEMPORAL ARTERY;  Surgeon: Algernon Huxley, MD;  Location: [facility] ORS;  Service: Vascular;  Laterality: Left;   BREAST CYST ASPIRATION  30+ years ago   [place]     mitral valve replacement   COLONOSCOPY N/A 12/28/2013   Procedure: COLONOSCOPY;  Surgeon: Danie Binder, MD;  Location: [facility];  Service: Endoscopy;  Laterality: N/A;  10:45 AM   FRACTURE SURGERY     tib - fib right leg   right leg has rod and screws     TUBAL LIGATION      Family History  Problem Relation Age of Onset   Stroke Mother 48       brain stem stroke   Breast cancer Neg Hx     No Known Allergies     Latest Ref Rng & Units 12/26/2022    2:51 PM 11/14/2022   10:58 AM 03/05/2022    2:55 PM  CBC  WBC 4.0 - 10.5 K/uL 6.1  5.7  7.2   Hemoglobin 12.0 - 15.0 g/dL 12.3  12.7  13.0   Hematocrit 36.0 - 46.0 % 36.6  38.6  39.5   Platelets 150 - 400  K/uL 265  261  322       CMP     Component Value Date/Time   NA 139 03/07/2022 0401   K 4.0 03/07/2022 0401   CL 104 03/07/2022 0401   CO2 26 03/07/2022 0401   GLUCOSE 138 (H) 03/07/2022 0401   BUN 24 (H) 03/07/2022 0401   CREATININE 1.02 (H) 03/07/2022 0401   CALCIUM 9.6 03/07/2022 0401   GFRNONAA >60 03/07/2022 0401     No results found.     Assessment & Plan:   1. Stroke-like symptoms The patient's biopsy shows no evidence of temporal arteritis.  The patient's wound is healing well.  Following discussion of the patient's issues and symptoms it is recommended she continue with her primary care for follow-up.  Patient has a neurologist, a rheumatologist also is suggested  2. Essential hypertension Continue antihypertensive medications as already ordered, these medications have been reviewed and there are no changes at this time.  3. Mixed hyperlipidemia Continue statin as ordered and reviewed, no changes at this time   Current Outpatient Medications on File Prior to Visit  Medication Sig Dispense Refill   acetaminophen (TYLENOL) 500 MG tablet Take 1,000 mg by mouth every 6 (six) hours as needed.     aspirin 81 MG tablet Take 81 mg by mouth daily.     atorvastatin (LIPITOR) 40 MG tablet Take 1 tablet (40 mg total) by mouth daily. 30 tablet 1   clopidogrel (PLAVIX) 75 MG tablet Take 75 mg by mouth daily.     ibuprofen (ADVIL) 200 MG tablet Take 600 mg by mouth every 6 (six) hours as needed.     loratadine (CLARITIN) 10 MG tablet Take 10 mg by mouth daily.     metFORMIN (GLUCOPHAGE) 500 MG tablet Take 500 mg by mouth daily with supper.     Multiple Vitamin (MULTIVITAMIN) tablet Take 1 tablet by mouth daily.     NON FORMULARY Calcium 600 + D    BID     OZEMPIC, 0.25 OR 0.5 MG/DOSE, 2 MG/3ML SOPN SMARTSIG:0.25 Milligram(s) SUB-Q Once a Week  pantoprazole (PROTONIX) 40 MG tablet Take 40 mg by mouth daily.     propranolol ER (INDERAL LA) 160 MG SR capsule Take 160 mg by  mouth daily.     sertraline (ZOLOFT) 100 MG tablet Take 100 mg by mouth daily. May take 100mg  or 1.5 tablet qhs     No current facility-administered medications on file prior to visit.    There are no Patient Instructions on file for this visit. No follow-ups on file.   Kris Hartmann, NP

## 2023-02-07 ENCOUNTER — Inpatient Hospital Stay
Admission: EM | Admit: 2023-02-07 | Discharge: 2023-02-09 | DRG: 661 | Disposition: A | Discharge status: Home / Self Care | Payer: Medicare PPO | Attending: Internal Medicine | Admitting: Internal Medicine

## 2023-02-07 ENCOUNTER — Emergency Department: Payer: Medicare PPO

## 2023-02-07 ENCOUNTER — Observation Stay: Payer: Medicare PPO | Admitting: Certified Registered"

## 2023-02-07 ENCOUNTER — Observation Stay: Payer: Medicare PPO

## 2023-02-07 ENCOUNTER — Other Ambulatory Visit: Payer: Self-pay

## 2023-02-07 ENCOUNTER — Encounter
Admission: EM | Disposition: A | Discharge status: Home / Self Care | Payer: Self-pay | Source: Home / Self Care | Attending: Internal Medicine

## 2023-02-07 DIAGNOSIS — I10 Essential (primary) hypertension: Secondary | ICD-10-CM | POA: Diagnosis present

## 2023-02-07 DIAGNOSIS — Z7982 Long term (current) use of aspirin: Secondary | ICD-10-CM

## 2023-02-07 DIAGNOSIS — J302 Other seasonal allergic rhinitis: Secondary | ICD-10-CM | POA: Diagnosis present

## 2023-02-07 DIAGNOSIS — Z952 Presence of prosthetic heart valve: Secondary | ICD-10-CM

## 2023-02-07 DIAGNOSIS — E785 Hyperlipidemia, unspecified: Secondary | ICD-10-CM | POA: Diagnosis present

## 2023-02-07 DIAGNOSIS — I959 Hypotension, unspecified: Secondary | ICD-10-CM | POA: Insufficient documentation

## 2023-02-07 DIAGNOSIS — K219 Gastro-esophageal reflux disease without esophagitis: Secondary | ICD-10-CM | POA: Diagnosis present

## 2023-02-07 DIAGNOSIS — N200 Calculus of kidney: Secondary | ICD-10-CM

## 2023-02-07 DIAGNOSIS — N10 Acute tubulo-interstitial nephritis: Principal | ICD-10-CM | POA: Diagnosis present

## 2023-02-07 DIAGNOSIS — N201 Calculus of ureter: Secondary | ICD-10-CM | POA: Diagnosis not present

## 2023-02-07 DIAGNOSIS — Z7902 Long term (current) use of antithrombotics/antiplatelets: Secondary | ICD-10-CM

## 2023-02-07 DIAGNOSIS — N136 Pyonephrosis: Principal | ICD-10-CM | POA: Diagnosis present

## 2023-02-07 DIAGNOSIS — N179 Acute kidney failure, unspecified: Secondary | ICD-10-CM | POA: Diagnosis not present

## 2023-02-07 DIAGNOSIS — G250 Essential tremor: Secondary | ICD-10-CM | POA: Diagnosis present

## 2023-02-07 DIAGNOSIS — B9620 Unspecified Escherichia coli [E. coli] as the cause of diseases classified elsewhere: Secondary | ICD-10-CM | POA: Diagnosis present

## 2023-02-07 DIAGNOSIS — Z79899 Other long term (current) drug therapy: Secondary | ICD-10-CM

## 2023-02-07 DIAGNOSIS — E1165 Type 2 diabetes mellitus with hyperglycemia: Secondary | ICD-10-CM | POA: Diagnosis not present

## 2023-02-07 DIAGNOSIS — D696 Thrombocytopenia, unspecified: Secondary | ICD-10-CM | POA: Insufficient documentation

## 2023-02-07 DIAGNOSIS — Z823 Family history of stroke: Secondary | ICD-10-CM

## 2023-02-07 DIAGNOSIS — A419 Sepsis, unspecified organism: Secondary | ICD-10-CM | POA: Diagnosis not present

## 2023-02-07 DIAGNOSIS — F32A Depression, unspecified: Secondary | ICD-10-CM | POA: Diagnosis present

## 2023-02-07 DIAGNOSIS — Z9851 Tubal ligation status: Secondary | ICD-10-CM

## 2023-02-07 DIAGNOSIS — Z8673 Personal history of transient ischemic attack (TIA), and cerebral infarction without residual deficits: Secondary | ICD-10-CM

## 2023-02-07 DIAGNOSIS — F419 Anxiety disorder, unspecified: Secondary | ICD-10-CM | POA: Diagnosis present

## 2023-02-07 DIAGNOSIS — Z7984 Long term (current) use of oral hypoglycemic drugs: Secondary | ICD-10-CM

## 2023-02-07 DIAGNOSIS — Z87442 Personal history of urinary calculi: Secondary | ICD-10-CM

## 2023-02-07 DIAGNOSIS — M810 Age-related osteoporosis without current pathological fracture: Secondary | ICD-10-CM | POA: Diagnosis present

## 2023-02-07 DIAGNOSIS — Z7985 Long-term (current) use of injectable non-insulin antidiabetic drugs: Secondary | ICD-10-CM

## 2023-02-07 DIAGNOSIS — Z66 Do not resuscitate: Secondary | ICD-10-CM | POA: Diagnosis present

## 2023-02-07 HISTORY — PX: CYSTOSCOPY WITH RETROGRADE PYELOGRAM, URETEROSCOPY AND STENT PLACEMENT: SHX5789

## 2023-02-07 LAB — URINALYSIS, COMPLETE (UACMP) WITH MICROSCOPIC
Bilirubin Urine: NEGATIVE
Glucose, UA: NEGATIVE mg/dL
Ketones, ur: NEGATIVE mg/dL
Nitrite: POSITIVE — AB
Protein, ur: 100 mg/dL — AB
RBC / HPF: >50 RBC/hpf (ref 0–5)
Specific Gravity, Urine: 1.021 (ref 1.005–1.030)
pH: 5 (ref 5.0–8.0)

## 2023-02-07 LAB — COMPREHENSIVE METABOLIC PANEL
ALT: 21 U/L (ref 0–44)
AST: 41 U/L (ref 15–41)
Albumin: 4.2 g/dL (ref 3.5–5.0)
Alkaline Phosphatase: 44 U/L (ref 38–126)
Anion gap: 16 — ABNORMAL HIGH (ref 5–15)
BUN: 44 mg/dL — ABNORMAL HIGH (ref 8–23)
CO2: 22 mmol/L (ref 22–32)
Calcium: 9.3 mg/dL (ref 8.9–10.3)
Chloride: 95 mmol/L — ABNORMAL LOW (ref 98–111)
Creatinine, Ser: 2.1 mg/dL — ABNORMAL HIGH (ref 0.44–1.00)
GFR, Estimated: 25 mL/min — ABNORMAL LOW (ref >60)
Glucose, Bld: 215 mg/dL — ABNORMAL HIGH (ref 70–99)
Potassium: 4.3 mmol/L (ref 3.5–5.1)
Sodium: 133 mmol/L — ABNORMAL LOW (ref 135–145)
Total Bilirubin: 1.1 mg/dL (ref 0.3–1.2)
Total Protein: 7.2 g/dL (ref 6.5–8.1)

## 2023-02-07 LAB — CBC
HCT: 36.2 % (ref 36.0–46.0)
Hemoglobin: 12.6 g/dL (ref 12.0–15.0)
MCH: 32.8 pg (ref 26.0–34.0)
MCHC: 34.8 g/dL (ref 30.0–36.0)
MCV: 94.3 fL (ref 80.0–100.0)
Platelets: 193 10*3/uL (ref 150–400)
RBC: 3.84 MIL/uL — ABNORMAL LOW (ref 3.87–5.11)
RDW: 13 % (ref 11.5–15.5)
WBC: 26.5 10*3/uL — ABNORMAL HIGH (ref 4.0–10.5)
nRBC: 0 % (ref 0.0–0.2)

## 2023-02-07 LAB — LACTIC ACID, PLASMA: Lactic Acid, Venous: 1.8 mmol/L (ref 0.5–1.9)

## 2023-02-07 LAB — GLUCOSE, CAPILLARY: Glucose-Capillary: 127 mg/dL — ABNORMAL HIGH (ref 70–99)

## 2023-02-07 LAB — CBG MONITORING, ED: Glucose-Capillary: 158 mg/dL — ABNORMAL HIGH (ref 70–99)

## 2023-02-07 SURGERY — CYSTOURETEROSCOPY, WITH RETROGRADE PYELOGRAM AND STENT INSERTION
Anesthesia: General | Site: Ureter | Laterality: Left

## 2023-02-07 MED ORDER — ONDANSETRON HCL 4 MG PO TABS: 4.0000 mg | ORAL_TABLET | Freq: Four times a day (QID) | ORAL | Status: DC | PRN

## 2023-02-07 MED ORDER — ROCURONIUM BROMIDE 10 MG/ML (PF) SYRINGE
PREFILLED_SYRINGE | INTRAVENOUS | Status: AC
  Filled 2023-02-07: qty 10

## 2023-02-07 MED ORDER — ENOXAPARIN SODIUM 30 MG/0.3ML IJ SOSY: 30.0000 mg | PREFILLED_SYRINGE | INTRAMUSCULAR | Status: DC

## 2023-02-07 MED ORDER — LACTATED RINGERS IV SOLN: INTRAVENOUS | Status: AC

## 2023-02-07 MED ORDER — KETAMINE HCL 10 MG/ML IJ SOLN
INTRAMUSCULAR | Status: DC | PRN
  Administered 2023-02-07: 50 mg via INTRAVENOUS

## 2023-02-07 MED ORDER — SODIUM CHLORIDE 0.9 % IV SOLN: INTRAVENOUS | Status: DC | PRN

## 2023-02-07 MED ORDER — INSULIN ASPART 100 UNIT/ML IJ SOLN
0.0000 [IU] | Freq: Three times a day (TID) | INTRAMUSCULAR | Status: DC
  Administered 2023-02-08: 1 [IU] via SUBCUTANEOUS
  Administered 2023-02-08: 2 [IU] via SUBCUTANEOUS
  Administered 2023-02-09: 1 [IU] via SUBCUTANEOUS
  Filled 2023-02-07 (×3): qty 1

## 2023-02-07 MED ORDER — DEXAMETHASONE SODIUM PHOSPHATE 10 MG/ML IJ SOLN
INTRAMUSCULAR | Status: AC
  Filled 2023-02-07: qty 1

## 2023-02-07 MED ORDER — ONDANSETRON HCL 4 MG/2ML IJ SOLN: 4.0000 mg | Freq: Four times a day (QID) | INTRAMUSCULAR | Status: DC | PRN

## 2023-02-07 MED ORDER — PANTOPRAZOLE SODIUM 40 MG PO TBEC
40.0000 mg | DELAYED_RELEASE_TABLET | Freq: Every day | ORAL | Status: DC
  Administered 2023-02-08 – 2023-02-09 (×2): 40 mg via ORAL
  Filled 2023-02-07 (×2): qty 1

## 2023-02-07 MED ORDER — FENTANYL CITRATE (PF) 100 MCG/2ML IJ SOLN
INTRAMUSCULAR | Status: AC
  Filled 2023-02-07: qty 2

## 2023-02-07 MED ORDER — DEXAMETHASONE SODIUM PHOSPHATE 10 MG/ML IJ SOLN
INTRAMUSCULAR | Status: DC | PRN
  Administered 2023-02-07: 5 mg via INTRAVENOUS

## 2023-02-07 MED ORDER — SODIUM CHLORIDE 0.9% FLUSH
3.0000 mL | Freq: Two times a day (BID) | INTRAVENOUS | Status: DC
  Administered 2023-02-07 – 2023-02-09 (×4): 3 mL via INTRAVENOUS

## 2023-02-07 MED ORDER — PHENYLEPHRINE 80 MCG/ML (10ML) SYRINGE FOR IV PUSH (FOR BLOOD PRESSURE SUPPORT)
PREFILLED_SYRINGE | INTRAVENOUS | Status: AC
  Filled 2023-02-07: qty 10

## 2023-02-07 MED ORDER — ACETAMINOPHEN 10 MG/ML IV SOLN
1000.0000 mg | Freq: Once | INTRAVENOUS | Status: DC | PRN
  Administered 2023-02-07: 1000 mg via INTRAVENOUS

## 2023-02-07 MED ORDER — SODIUM CHLORIDE 0.9 % IR SOLN
Status: DC | PRN
  Administered 2023-02-07: 3000 mL

## 2023-02-07 MED ORDER — HYDROMORPHONE HCL 1 MG/ML IJ SOLN
0.5000 mg | INTRAMUSCULAR | Status: DC | PRN
  Administered 2023-02-07 – 2023-02-09 (×5): 0.5 mg via INTRAVENOUS
  Filled 2023-02-07 (×5): qty 1

## 2023-02-07 MED ORDER — PROPOFOL 10 MG/ML IV BOLUS
INTRAVENOUS | Status: AC
  Filled 2023-02-07: qty 20

## 2023-02-07 MED ORDER — SODIUM CHLORIDE 0.9 % IV BOLUS
1000.0000 mL | Freq: Once | INTRAVENOUS | Status: AC
  Administered 2023-02-07: 1000 mL via INTRAVENOUS

## 2023-02-07 MED ORDER — FENTANYL CITRATE (PF) 100 MCG/2ML IJ SOLN
INTRAMUSCULAR | Status: DC | PRN
  Administered 2023-02-07: 50 ug via INTRAVENOUS

## 2023-02-07 MED ORDER — SUCCINYLCHOLINE CHLORIDE 200 MG/10ML IV SOSY
PREFILLED_SYRINGE | INTRAVENOUS | Status: DC | PRN
  Administered 2023-02-07: 100 mg via INTRAVENOUS

## 2023-02-07 MED ORDER — ASPIRIN 81 MG PO CHEW
81.0000 mg | CHEWABLE_TABLET | Freq: Every day | ORAL | Status: DC
  Administered 2023-02-08 – 2023-02-09 (×2): 81 mg via ORAL
  Filled 2023-02-07 (×2): qty 1

## 2023-02-07 MED ORDER — SERTRALINE HCL 50 MG PO TABS
150.0000 mg | ORAL_TABLET | Freq: Every day | ORAL | Status: DC
  Administered 2023-02-08: 150 mg via ORAL
  Filled 2023-02-07: qty 3

## 2023-02-07 MED ORDER — ACETAMINOPHEN 10 MG/ML IV SOLN
INTRAVENOUS | Status: AC
  Filled 2023-02-07: qty 100

## 2023-02-07 MED ORDER — CLOPIDOGREL BISULFATE 75 MG PO TABS
75.0000 mg | ORAL_TABLET | Freq: Every day | ORAL | Status: DC
  Administered 2023-02-08 – 2023-02-09 (×2): 75 mg via ORAL
  Filled 2023-02-07 (×2): qty 1

## 2023-02-07 MED ORDER — FENTANYL CITRATE (PF) 100 MCG/2ML IJ SOLN: 25.0000 ug | INTRAMUSCULAR | Status: DC | PRN

## 2023-02-07 MED ORDER — SODIUM CHLORIDE 0.9 % IV SOLN
2.0000 g | Freq: Once | INTRAVENOUS | Status: AC
  Administered 2023-02-07: 2 g via INTRAVENOUS
  Filled 2023-02-07: qty 12.5

## 2023-02-07 MED ORDER — PHENYLEPHRINE HCL (PRESSORS) 10 MG/ML IV SOLN
INTRAVENOUS | Status: DC | PRN
  Administered 2023-02-07 (×2): 160 ug via INTRAVENOUS

## 2023-02-07 MED ORDER — LACTATED RINGERS IV BOLUS: 1000.0000 mL | Freq: Once | INTRAVENOUS | Status: DC

## 2023-02-07 MED ORDER — ONDANSETRON HCL 4 MG/2ML IJ SOLN
4.0000 mg | Freq: Once | INTRAMUSCULAR | Status: AC
  Administered 2023-02-07: 4 mg via INTRAVENOUS
  Filled 2023-02-07: qty 2

## 2023-02-07 MED ORDER — LIDOCAINE HCL (CARDIAC) PF 100 MG/5ML IV SOSY
PREFILLED_SYRINGE | INTRAVENOUS | Status: DC | PRN
  Administered 2023-02-07: 40 mg via INTRAVENOUS

## 2023-02-07 MED ORDER — ACETAMINOPHEN 325 MG PO TABS
650.0000 mg | ORAL_TABLET | Freq: Four times a day (QID) | ORAL | Status: DC | PRN
  Administered 2023-02-08 – 2023-02-09 (×2): 650 mg via ORAL
  Filled 2023-02-07 (×2): qty 2

## 2023-02-07 MED ORDER — ONDANSETRON HCL 4 MG/2ML IJ SOLN: 4.0000 mg | Freq: Once | INTRAMUSCULAR | Status: DC | PRN

## 2023-02-07 MED ORDER — KETAMINE HCL 50 MG/5ML IJ SOSY
PREFILLED_SYRINGE | INTRAMUSCULAR | Status: AC
  Filled 2023-02-07: qty 5

## 2023-02-07 MED ORDER — IOHEXOL 180 MG/ML  SOLN
INTRAMUSCULAR | Status: DC | PRN
  Administered 2023-02-07: 10 mL

## 2023-02-07 MED ORDER — SODIUM CHLORIDE 0.9 % IV SOLN
2.0000 g | INTRAVENOUS | Status: DC
  Filled 2023-02-07: qty 12.5

## 2023-02-07 MED ORDER — ONDANSETRON HCL 4 MG/2ML IJ SOLN
INTRAMUSCULAR | Status: AC
  Filled 2023-02-07: qty 2

## 2023-02-07 MED ORDER — MORPHINE SULFATE (PF) 2 MG/ML IV SOLN
2.0000 mg | Freq: Once | INTRAVENOUS | Status: AC
  Administered 2023-02-07: 2 mg via INTRAVENOUS
  Filled 2023-02-07: qty 1

## 2023-02-07 MED ORDER — OXYCODONE HCL 5 MG/5ML PO SOLN: 5.0000 mg | Freq: Once | ORAL | Status: DC | PRN

## 2023-02-07 MED ORDER — OXYCODONE HCL 5 MG PO TABS: 5.0000 mg | ORAL_TABLET | Freq: Once | ORAL | Status: DC | PRN

## 2023-02-07 MED ORDER — SUCCINYLCHOLINE CHLORIDE 200 MG/10ML IV SOSY
PREFILLED_SYRINGE | INTRAVENOUS | Status: AC
  Filled 2023-02-07: qty 10

## 2023-02-07 MED ORDER — PHENYLEPHRINE HCL-NACL 20-0.9 MG/250ML-% IV SOLN
INTRAVENOUS | Status: AC
  Filled 2023-02-07: qty 250

## 2023-02-07 MED ORDER — ACETAMINOPHEN 650 MG RE SUPP: 650.0000 mg | Freq: Four times a day (QID) | RECTAL | Status: DC | PRN

## 2023-02-07 MED ORDER — PROPOFOL 10 MG/ML IV BOLUS
INTRAVENOUS | Status: DC | PRN
  Administered 2023-02-07: 80 mg via INTRAVENOUS

## 2023-02-07 MED ORDER — LIDOCAINE HCL (PF) 2 % IJ SOLN
INTRAMUSCULAR | Status: AC
  Filled 2023-02-07: qty 5

## 2023-02-07 MED ORDER — ATORVASTATIN CALCIUM 20 MG PO TABS
40.0000 mg | ORAL_TABLET | Freq: Every day | ORAL | Status: DC
  Administered 2023-02-08 – 2023-02-09 (×2): 40 mg via ORAL
  Filled 2023-02-07 (×2): qty 2

## 2023-02-07 SURGICAL SUPPLY — 10 items
BAG DRN RND TRDRP ANRFLXCHMBR (UROLOGICAL SUPPLIES) ×1
BAG URINE DRAIN 2000ML AR STRL (UROLOGICAL SUPPLIES) IMPLANT
CATH FOLEY 2WAY  5CC 16FR (CATHETERS) ×1
CATH FOLEY 2WAY 5CC 16FR (CATHETERS) ×1
CATH URTH 16FR FL 2W BLN LF (CATHETERS) IMPLANT
GUIDEWIRE STR DUAL SENSOR (WIRE) IMPLANT
HOLDER FOLEY CATH W/STRAP (MISCELLANEOUS) IMPLANT
KIT TURNOVER CYSTO (KITS) IMPLANT
PACK CYSTO (CUSTOM PROCEDURE TRAY) IMPLANT
STENT URET 6FRX24 CONTOUR (STENTS) IMPLANT

## 2023-02-07 NOTE — Anesthesia Preprocedure Evaluation (Signed)
Anesthesia Evaluation  Patient identified by MRN, date of birth, ID band Patient awake  General Assessment Comment:  Patient with rigors in front of me. AO x 3.  Nausea and vomiting. Ozempic 6 days ago. Surgeon declaring emergency.  Reviewed: Allergy & Precautions, NPO status , Patient's Chart, lab work & pertinent test results  History of Anesthesia Complications Negative for: history of anesthetic complications  Airway Mallampati: III  TM Distance: >3 FB Neck ROM: Full    Dental  (+) Missing, Poor Dentition   Pulmonary neg pulmonary ROS, neg sleep apnea, neg COPD, Patient abstained from smoking.Not current smoker   Pulmonary exam normal breath sounds clear to auscultation       Cardiovascular Exercise Tolerance: Good METShypertension, (-) CAD and (-) Past MI (-) dysrhythmias + Valvular Problems/Murmurs MR  Rhythm:Regular Rate:Normal - Systolic murmurs    Neuro/Psych  Headaches PSYCHIATRIC DISORDERS Anxiety Depression    Residual vision deficits bilaterally CVA    GI/Hepatic ,GERD  Medicated,,(+)     (-) substance abuse    Endo/Other  diabetes  GLP1 last taken 6 days ago  Renal/GU ARFRenal disease     Musculoskeletal  (+) Arthritis ,    Abdominal   Peds  Hematology   Anesthesia Other Findings Past Medical History: No date: Anxiety No date: Arthritis No date: Depression No date: Diabetes mellitus without complication (HCC) No date: GERD (gastroesophageal reflux disease) No date: H/O hiatal hernia No date: Headache No date: Hyperlipemia No date: Hypertension No date: Neuromuscular disorder (HCC) No date: Osteoporosis No date: Pneumonia No date: Seasonal allergies No date: Stroke [facility])     Comment:  x 2 Optic nevre cvas No date: Tremor, essential  Reproductive/Obstetrics                              Anesthesia Physical Anesthesia Plan  ASA: 3 and emergent  Anesthesia  Plan: General   Post-op Pain Management: Ofirmev IV (intra-op)*   Induction: Intravenous and Rapid sequence  PONV Risk Score and Plan: 4 or greater and Ondansetron, Dexamethasone and Treatment may vary due to age or medical condition  Airway Management Planned: Oral ETT  Additional Equipment: None  Intra-op Plan:   Post-operative Plan: Extubation in OR  Informed Consent: I have reviewed the patients History and Physical, chart, labs and discussed the procedure including the risks, benefits and alternatives for the proposed anesthesia with the patient or authorized representative who has indicated his/her understanding and acceptance.     Dental advisory given  Plan Discussed with: CRNA and Surgeon  Anesthesia Plan Comments: (Discussed risks of anesthesia with patient, including PONV, aspiration, sore throat, lip/dental/eye damage. Rare risks discussed as well, such as cardiorespiratory and neurological sequelae, and allergic reactions. Discussed the role of CRNA in patient's perioperative care. Patient understands. Patient counseled on being higher risk for anesthesia due to comorbidities: active sepsis. Patient was told about increased risk of cardiac and respiratory events, including death. Discussed with surgeon who is declaring this case emergent)         Anesthesia Quick Evaluation

## 2023-02-07 NOTE — Assessment & Plan Note (Addendum)
With leukocytosis.  Patient on Maxipime IV.  Follow-up urine and blood cultures.  White blood cell count trending better but still elevated at 17.3.

## 2023-02-07 NOTE — Consult Note (Signed)
Pharmacy Antibiotic Note  Kathleen Frazier is a 67 y.o. female with medical history including DM, HTN, mitral valve replacement, left optic nerve infract, HLD admitted on 02/07/2023 with  pyelonephritis / kidney stones . Patient taken emergently to OR 3/28. Pharmacy has been consulted for cefepime dosing.  Scr 2.1, baseline 1.1 - 1.2  Plan:  Cefepime 2 g IV q24h  Continue to follow for renal dose adjustments. Monitor cultures and clinical course.  Height: 5\' 2"  (157.5 cm) Weight: 54 kg (119 lb) IBW/kg (Calculated) : 50.1  Temp (24hrs), Avg:98.8 F (37.1 C), Min:97.8 F (36.6 C), Max:99.7 F (37.6 C)  Recent Labs  Lab 02/07/23 1308  WBC 26.5*  CREATININE 2.10*    Estimated Creatinine Clearance: 20.6 mL/min (A) (by C-G formula based on SCr of 2.1 mg/dL (H)).    No Known Allergies  Antimicrobials this admission: Cefepime 3/28 >>   Dose adjustments this admission: N/A  Microbiology results: 3/28 BCx: pending  Thank you for allowing pharmacy to be a part of this patient's care.  Benita Gutter 02/07/2023 5:51 PM

## 2023-02-07 NOTE — Assessment & Plan Note (Addendum)
Creatinine improved from 2.10 down to 1.37

## 2023-02-07 NOTE — Transfer of Care (Signed)
Immediate Anesthesia Transfer of Care Note  Patient: Kathleen Frazier  Procedure(s) Performed: LEFT CYSTOSCOPY WITH RETROGRADE PYELOGRAM,  AND STENT PLACEMENT (Left: Ureter)  Patient Location: PACU  Anesthesia Type:General  Level of Consciousness: drowsy and patient cooperative  Airway & Oxygen Therapy: Patient Spontanous Breathing and Patient connected to face mask oxygen  Post-op Assessment: Report given to RN and Post -op Vital signs reviewed and stable  Post vital signs: Reviewed and stable  Last Vitals:  Vitals Value Taken Time  BP 112/64 02/07/23 1830  Temp    Pulse 100 02/07/23 1833  Resp 20 02/07/23 1834  SpO2 100 % 02/07/23 1833  Vitals shown include unvalidated device data.  Last Pain:  Vitals:   02/07/23 1738  TempSrc: Oral  PainSc: 3       Patients Stated Pain Goal: 0 ([id number])  Complications: No notable events documented.

## 2023-02-07 NOTE — Op Note (Signed)
Date of procedure: 02/07/23  Preoperative diagnosis:  Left ureteral stone UTI, sepsis from urinary source  Postoperative diagnosis:  Same  Procedure: Cystoscopy, left retrograde pyelogram with intraoperative interpretation, left ureteral stent placement  Surgeon: Nickolas Madrid, MD  Anesthesia: General  Complications: None  Intraoperative findings:  Normal bladder, uncomplicated left ureteral stent placement with purulent drainage  EBL: Minimal  Specimens: None  Drains: Left 6 French by 24 cm ureteral stent, 16 French Foley  Indication: Kathleen Frazier is a 67 y.o. patient with 5 mm left ureteral stone, infected urine, and clinical on lab evidence of sepsis from urinary source.  After reviewing the management options for treatment, they elected to proceed with the above surgical procedure(s). We have discussed the potential benefits and risks of the procedure, side effects of the proposed treatment, the likelihood of the patient achieving the goals of the procedure, and any potential problems that might occur during the procedure or recuperation. Informed consent has been obtained.  Description of procedure:  The patient was taken to the operating room and general anesthesia was induced. SCDs were placed for DVT prophylaxis. The patient was placed in the dorsal lithotomy position, prepped and draped in the usual sterile fashion, and preoperative antibiotics(cefepime in the ED) were administered. A preoperative time-out was performed.   A 21 French rigid cystoscope was used to intubate the urethra and thorough cystoscopy was performed.  The bladder was grossly normal throughout.  A sensor wire was used to intubate the left ureteral orifice and this advanced easily up to the kidney under fluoroscopic vision.  A 5 French access catheter was advanced over the wire into the kidney, and a retrograde pyelogram showed moderate hydronephrosis.  The wire was replaced and a 6 [place] by 24 cm  ureteral stent was uneventfully placed with a curl in the upper pole, as well as under direct vision in the bladder.  There was purulent drainage through the sideport of the stent.  A 16 French Foley was placed with return of pink urine, 10 mL were placed in the balloon.  Disposition: Stable to PACU  Plan: -Can remove Foley when clinically improved and afebrile greater than 24 hours -Recommend 2 weeks culture appropriate antibiotics -Urology will coordinate outpatient ureteroscopy/laser/stent change in 2 to 4 weeks once infection treated  Nickolas Madrid, MD

## 2023-02-07 NOTE — ED Notes (Signed)
Report given to OR at this time.

## 2023-02-07 NOTE — Assessment & Plan Note (Addendum)
Hemoglobin A1c pending.  Continue sliding scale insulin.

## 2023-02-07 NOTE — Anesthesia Procedure Notes (Signed)
Procedure Name: Intubation Date/Time: 02/07/2023 6:07 PM  Performed by: Levin Erp, CRNAPre-anesthesia Checklist: Patient identified, Emergency Drugs available, Suction available, Patient being monitored and Timeout performed Patient Re-evaluated:Patient Re-evaluated prior to induction Oxygen Delivery Method: Circle system utilized Preoxygenation: Pre-oxygenation with 100% oxygen Induction Type: Rapid sequence and IV induction Laryngoscope Size: McGraph and 3 Grade View: Grade I Tube type: Oral Tube size: 6.5 mm Number of attempts: 1 Airway Equipment and Method: Stylet Secured at: 20 cm Tube secured with: Tape Dental Injury: Teeth and Oropharynx as per pre-operative assessment  Comments: Arytenoids red, thick yellow mucus noted in oropharynx

## 2023-02-07 NOTE — H&P (View-Only) (Signed)
Urology Consult   I have been asked to see the patient by Dr. Archie Balboa, for evaluation and management of left proximal ureteral stone, hydronephrosis, concern for UTI and sepsis from urinary source.  Chief Complaint: Left flank pain  HPI:  Kathleen Frazier is a 67 y.o. female who presented to the ER today with 3 to 4 days of left-sided flank pain, nausea, and malaise.  Evaluation in the ER showed a 5 mm left proximal ureteral stone with moderate to severe hydronephrosis and perinephric stranding.  Urinalysis was infected, leukocytosis to 26k, and AKI with creatinine 2.1, EGFR 25.  She denies any prior history of kidney stones.  Denies any chest pain or shortness of breath.  Temp 99.7, tachycardic to 110, hypotensive with blood pressure 85/57 concerning for sepsis from urinary source.  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   Tremor, essential     Surgical History: Past Surgical History:  Procedure Laterality Date   ARTERY BIOPSY Left 12/27/2022   Procedure: BIOPSY TEMPORAL ARTERY;  Surgeon: Algernon Huxley, MD;  Location: [facility] ORS;  Service: Vascular;  Laterality: Left;   BREAST CYST ASPIRATION  30+ years ago   CARDIAC VALVE REPLACEMENT     mitral valve replacement   COLONOSCOPY N/A 12/28/2013   Procedure: COLONOSCOPY;  Surgeon: Danie Binder, MD;  [facility]: [facility];  Service: Endoscopy;  Laterality: N/A;  10:45 AM   FRACTURE SURGERY     tib - fib right leg   right leg has rod and screws     TUBAL LIGATION      Allergies: No Known Allergies  Family History: Family History  Problem Relation Age of Onset   Stroke Mother 50       brain stem  stroke   Breast cancer Neg Hx     Social History:  reports that she has never smoked. She has never used smokeless tobacco. She reports current alcohol use. She reports that she does not use drugs.  ROS: Negative aside from those stated in the HPI.  Physical Exam: BP 100/78   Pulse (!) 102   Temp 97.8 F (36.6 C) (Oral)   Resp 18   Ht 5\' 2"  (1.575 m)   Wt 54 kg   SpO2 97%   BMI 21.77 kg/m    Constitutional:  Alert and oriented, No acute distress. Cardiovascular: Tachycardic, regular rhythm Respiratory: Clear to auscultation bilaterally GI: Abdomen is soft, nontender, nondistended, no abdominal masses   Laboratory Data: Reviewed, see HPI  Pertinent Imaging: I have personally reviewed the CT showing a 5 mm left proximal ureteral stone with hydronephrosis and perinephric stranding.  Assessment & Plan:   67 year old female with left-sided flank pain, nausea, and fevers at home with evaluation today showing a 5 mm left proximal ureteral stone with lab work and clinical picture concerning for UTI and sepsis from urinary source.  We discussed the need for drainage in the setting of an infected and obstructed system.  A ureteral stent is a small plastic tube that is  placed cystoscopically with one end in the kidney and the other end in the bladder that allows the infection from the kidney to drain, and relieves pain from the obstructing stone.  We discussed the risks at length including bleeding, infection, sepsis, death, ureteral injury, and stent related symptoms including urgency/frequency/dysuria/flank pain/gross hematuria.  There is a low, but not 0, risk of inability to pass the ureteral stent alongside the stone from below which would require percutaneous nephrostomy tube by interventional radiology.  Finally, we discussed possible prolonged hospitalization and recovery, possible temporary Foley catheter placement, and 10 to 14-day course of antibiotics.  We reviewed the need for  a follow-up procedure for definitive management of their stone when the infection has been treated in 2 to 3 weeks with either ureteroscopy/laser lithotripsy.  We discussed need for emergent stent placement in the setting of suspected sepsis from urinary source and risk of decline, as well as the risk of anesthesia with her n.p.o. status and Ozempic.  Recommendations: -Agree with hospitalist admission for antibiotics and resuscitation -OR tonight for cystoscopy and left ureteral stent placement -Discussed need for definitive treatment with ureteroscopy in 2 to 3 weeks after infection treated with both patient and family  Billey Co, MD  Total time spent on the floor was 60 minutes, with greater than 50% spent in counseling and coordination of care with the patient regarding infected left ureteral stone and need for emergent stent placement. [place] [address], [place], [place] [address] [phone]

## 2023-02-07 NOTE — ED Provider Notes (Signed)
[facility] Provider Note    Event Date/Time   First MD Initiated Contact with Patient 02/07/23 1515     (approximate)   History   Abdominal Pain   HPI  Kathleen Frazier is a 67 y.o. female who presents to the emergency department today because of concerns for abdominal pain.  Located in the left side of her abdomen.  That started 3 days ago as a dull plane but has gradually gotten more severe.  She has had associated nausea with some vomiting and some diarrhea.  She denies any blood in the vomiting or diarrhea.  She has had decreased oral intake.  Has had fevers.  States had similar pain about 3 weeks ago although did not last as long nor was it is severe.  She was not seen for this.  Patient has tried some Tylenol at home without any significant relief.  Denies any unusual ingestions or known sick contacts.      Physical Exam   Triage Vital Signs: ED Triage Vitals  Enc Vitals Group     BP 02/07/23 1253 101/73     Pulse Rate 02/07/23 1253 (!) 104     Resp 02/07/23 1253 16     Temp 02/07/23 1253 97.8 F (36.6 C)     Temp Source 02/07/23 1253 Oral     SpO2 02/07/23 1253 98 %     Weight 02/07/23 1254 119 lb (54 kg)     Height 02/07/23 1254 5\' 2"  (1.575 m)     Head Circumference --      Peak Flow --      Pain Score 02/07/23 1305 8     Pain Loc --      Pain Edu? --      Excl. in [place]? --     Most recent vital signs: Vitals:   02/07/23 1253 02/07/23 1500  BP: 101/73 (!) 96/56  Pulse: (!) 104 98  Resp: 16 20  Temp: 97.8 F (36.6 C)   SpO2: 98% 94%   General: Awake, alert, oriented. CV:  Good peripheral perfusion. Regular rate and rhythm. Resp:  Normal effort. Lungs clear. Abd:  No distention. Soft. Tender to palpation in the left side. No rebound or guarding.     ED Results / Procedures / Treatments   Labs (all labs ordered are listed, but only abnormal results are displayed) Labs Reviewed  URINALYSIS, COMPLETE (UACMP) WITH  MICROSCOPIC - Abnormal; Notable for the following components:      Result Value   Color, Urine AMBER (*)    APPearance HAZY (*)    Hgb urine dipstick LARGE (*)    Protein, ur 100 (*)    Nitrite POSITIVE (*)    Leukocytes,Ua SMALL (*)    Bacteria, UA FEW (*)    All other components within normal limits  CBC - Abnormal; Notable for the following components:   WBC 26.5 (*)    RBC 3.84 (*)    All other components within normal limits  COMPREHENSIVE METABOLIC PANEL - Abnormal; Notable for the following components:   Sodium 133 (*)    Chloride 95 (*)    Glucose, Bld 215 (*)    BUN 44 (*)    Creatinine, Ser 2.10 (*)    GFR, Estimated 25 (*)    Anion gap 16 (*)    All other components within normal limits     EKG  [facility]   RADIOLOGY I independently interpreted and visualized the CT  abd/pel. My interpretation: Stranding around left kidney Radiology interpretation:  IMPRESSION:  Left kidney is larger than right with significant left perinephric  stranding. There is 5 mm calcific density in the proximal course of  left ureter at L4 level, most likely calculus in the proximal left  ureter causing moderate left hydronephrosis. Possibility of  superimposed pyelonephritis should be considered.    There are fluid density structures in the central portions in both  kidneys, possibly suggesting parapelvic cysts. There is 1.9 cm fluid  collection between the lower pole of left kidney and psoas muscle.  This may be part of perinephric edema due to pyelonephritis or  high-grade ureteric obstruction or early abscess.    There is no evidence of intestinal obstruction or pneumoperitoneum.  There is no ascites.    Hiatal hernia. Gallbladder stones. There are small pockets of air in  the urinary bladder which may be due to recent catheterization or  suggest cystitis.    Other findings as described in the body of the report.   I, Nance Pear, personally discussed these images and  results by phone with the on-call radiologist and used this discussion as part of my medical decision making.     PROCEDURES:  Critical Care performed: Yes  CRITICAL CARE Performed by: Nance Pear   Total critical care time: 30 minutes  Critical care time was exclusive of separately billable procedures and treating other patients.  Critical care was necessary to treat or prevent imminent or life-threatening deterioration.  Critical care was time spent personally by me on the following activities: development of treatment plan with patient and/or surrogate as well as nursing, discussions with consultants, evaluation of patient's response to treatment, examination of patient, obtaining history from patient or surrogate, ordering and performing treatments and interventions, ordering and review of laboratory studies, ordering and review of radiographic studies, pulse oximetry and re-evaluation of patient's condition.   Procedures    MEDICATIONS ORDERED IN ED: Medications - No data to display   IMPRESSION / MDM / [place] / ED COURSE  I reviewed the triage vital signs and the nursing notes.                              Differential diagnosis includes, but is not limited to, diverticulitis, UTI, kidney stone, enteritis  Patient's presentation is most consistent with acute presentation with potential threat to life or bodily function.   The patient is on the cardiac monitor to evaluate for evidence of arrhythmia and/or significant heart rate changes.  Patient presented to the emergency department today because of concerns for left sided abdominal pain.  On exam patient is tender to the left side of her abdomen.  Blood work shows a significant leukocytosis.  CT scan was obtained.  This was consistent with ureteral stone and pyelonephritis. IV antibiotics were ordered. Discussed with Dr. Glori Luis with urology who will plan on stenting. Discussed findings and plan with  patient.       FINAL CLINICAL IMPRESSION(S) / ED DIAGNOSES   Final diagnoses:  Acute pyelonephritis      Note:  This document was prepared using Dragon voice recognition software and may include unintentional dictation errors.    Nance Pear, MD 02/07/23 2003

## 2023-02-07 NOTE — ED Notes (Signed)
Patient to OR at this time

## 2023-02-07 NOTE — Assessment & Plan Note (Deleted)
Patient is currently on propranolol only, which is likely for her anxiety, but however we will hold any antihypertensives at this time in the setting of low blood pressure.  - Restart home propranolol when able

## 2023-02-07 NOTE — Assessment & Plan Note (Addendum)
On aspirin and Plavix.

## 2023-02-07 NOTE — H&P (Addendum)
History and Physical    Patient: Kathleen Frazier [id number] DOB: Dec 20, 1955 DOA: 02/07/2023 DOS: the patient was seen and examined on 02/07/2023 PCP: Rusty Aus, MD  Patient coming from: Home  Chief Complaint:  Chief Complaint  Patient presents with   Abdominal Pain   HPI: Kathleen Frazier is a 67 y.o. female with medical history significant of type 2 diabetes, hypertension, mitral valve replacement, left optic nerve infarct, hyperlipidemia, who presents to the ED due to abdominal pain.  Kathleen Frazier states that on 02/04/2023, she began to develop left-sided abdominal and flank pain that was initially mild but continued to gradually worsen.  She then began to experience fevers with chills, nausea, vomiting and diarrhea.  She denies any shortness of breath or chest pain.  She denies any urinary symptoms.  ED course: On arrival to the ED, patient was normotensive at 101/73 with heart rate of 104.  She was initially afebrile at 97.8.  She was saturating at 98% on room air.  Initial workup notable for WBC of 26.5, hemoglobin of 12.6, sodium of 133, chloride 95, glucose 215, BUN 44, creatinine 2.90, anion gap of 16, and GFR of 25.  Urinalysis demonstrated large hemoglobin small leukocytes, positive nitrites and few bacteria. CT of the abdomen was obtained that demonstrated significant left kidney perinephritic stranding with a 5 mm calcified density in the proximal course of the left ureter causing moderate left hydronephrosis.  In addition, there is a 1.9 cm fluid collection between the lower pole of the left kidney and psoas muscle concerning for pyelonephritis versus obstruction versus abscess.  Urology consulted with plans for the OR.  [facility] contacted for admission.  Review of Systems: As mentioned in the history of present illness. All other systems reviewed and are negative.  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   Tremor, essential    Past Surgical History:  Procedure Laterality Date   ARTERY BIOPSY Left 12/27/2022   Procedure: BIOPSY TEMPORAL ARTERY;  Surgeon: Algernon Huxley, MD;  Location: [facility] ORS;  Service: Vascular;  Laterality: Left;   BREAST CYST ASPIRATION  30+ years ago   CARDIAC VALVE REPLACEMENT     mitral valve replacement   COLONOSCOPY N/A 12/28/2013   Procedure: COLONOSCOPY;  Surgeon: Danie Binder, MD;  [facility];  Service: Endoscopy;  Laterality: N/A;  10:45 AM   FRACTURE SURGERY     tib - fib right leg   right leg has rod and screws     TUBAL LIGATION     Social History:  reports that she has never smoked. She has never used smokeless tobacco. She reports current alcohol use. She reports that she does not use drugs.  No Known Allergies  Family History  Problem Relation Age of Onset   Stroke Mother 7       brain stem stroke   Breast cancer Neg Hx     Prior to Admission medications   Medication Sig Start Date End Date Taking? Authorizing Provider  acetaminophen (TYLENOL) 500 MG tablet Take  1,000 mg by mouth every 6 (six) hours as needed.    [provider]  aspirin 81 MG tablet Take 81 mg by mouth daily.    [provider]  atorvastatin (LIPITOR) 40 MG tablet Take 1 tablet (40 mg total) by mouth daily. 03/08/22   Duard Brady, MD  clopidogrel (PLAVIX) 75 MG tablet Take 75 mg by mouth daily. 11/16/22 11/16/23  [provider]  ibuprofen (ADVIL) 200 MG tablet Take 600 mg by mouth every 6 (six) hours as needed.    [provider]  loratadine (CLARITIN) 10 MG tablet Take 10 mg by mouth daily.    [provider]  metFORMIN (GLUCOPHAGE) 500 MG tablet Take 500 mg by  mouth daily with supper. 02/28/22   [provider]  Multiple Vitamin (MULTIVITAMIN) tablet Take 1 tablet by mouth daily.    [provider]  NON FORMULARY Calcium 600 + D    BID    [provider]  OZEMPIC, 0.25 OR 0.5 MG/DOSE, 2 MG/3ML SOPN SMARTSIG:0.25 Milligram(s) SUB-Q Once a Week 12/06/22   [provider]  pantoprazole (PROTONIX) 40 MG tablet Take 40 mg by mouth daily.    [provider]  propranolol ER (INDERAL LA) 160 MG SR capsule Take 160 mg by mouth daily.    [provider]  sertraline (ZOLOFT) 100 MG tablet Take 100 mg by mouth daily. May take 100mg  or 1.5 tablet qhs    [provider]    Physical Exam: Vitals:   02/07/23 1254 02/07/23 1500 02/07/23 1530 02/07/23 1738  BP:  (!) 96/56 100/78 (!) 85/57  Pulse:  98 (!) 102 (!) 108  Resp:  20 18 20   Temp:    99.7 F (37.6 C)  TempSrc:    Oral  SpO2:  94% 97% 98%  Weight: 54 kg   54 kg  Height: 5\' 2"  (1.575 m)   5\' 2"  (1.575 m)   Physical Exam Vitals and nursing note reviewed.  Constitutional:      General: She is not in acute distress.    Appearance: She is normal weight. She is ill-appearing.  HENT:     Head: Normocephalic and atraumatic.     Mouth/Throat:     Pharynx: Oropharynx is clear.     Comments: Very dry oropharynx Eyes:     Extraocular Movements: Extraocular movements intact.     Pupils: Pupils are equal, round, and reactive to light.  Cardiovascular:     Rate and Rhythm: Regular rhythm. Tachycardia present.     Heart sounds: No murmur heard. Pulmonary:     Effort: Pulmonary effort is normal. No respiratory distress.     Breath sounds: Normal breath sounds. No wheezing, rhonchi or rales.  Abdominal:     General: There is no distension.     Palpations: Abdomen is soft.     Tenderness: There is abdominal tenderness (Significant tenderness to palpation in the left upper and lower quadrants). There is guarding.  Musculoskeletal:     Right lower  leg: No edema.     Left lower leg: No edema.  Skin:    General: Skin is warm and dry.  Neurological:     Mental Status: She is alert and oriented to person, place, and time. Mental status is at baseline.  Psychiatric:        Mood and Affect: Mood normal.        Behavior: Behavior normal.    Data Reviewed: CBC with WBC 26.5, hemoglobin  of 12.6, and hemoglobin of 193.  CMP with sodium of 133, potassium 4.3 , chloride 95, bicarb 22, glucose 215, BUN 44, creatinine 2.1, anion gap 16, albumin 4.2, AST 41, ALT 21 and GFR Urinalysis with large hemoglobin, small leukocytes, positive nitrites, proteinuria, few bacteria and 21-50 WBC/hpf  DG OR UROLOGY CYSTO IMAGE ([facility] ONLY)  Result Date: 02/07/2023 There is no interpretation for this exam.  This order is for images obtained during a surgical procedure.  Please See "Surgeries" Tab for more information regarding the procedure.   DG OR UROLOGY CYSTO IMAGE ([facility] ONLY)  Result Date: 02/07/2023 There is no interpretation for this exam.  This order is for images obtained during a surgical procedure.  Please See "Surgeries" Tab for more information regarding the procedure.   CT ABDOMEN PELVIS WO CONTRAST  Addendum Date: 02/07/2023   ADDENDUM REPORT: 02/07/2023 16:26 ADDENDUM: Imaging findings were related to the patient's treating provider by telephone call. Electronically Signed   By: Elmer Picker M.D.   On: 02/07/2023 16:26   Result Date: 02/07/2023 CLINICAL DATA:  Left-sided abdominal pain EXAM: CT ABDOMEN AND PELVIS WITHOUT CONTRAST TECHNIQUE: Multidetector CT imaging of the abdomen and pelvis was performed following the standard protocol without IV contrast. RADIATION DOSE REDUCTION: This exam was performed according to the departmental dose-optimization program which includes automated exposure control, adjustment of the mA and/or kV according to patient size and/or use of iterative reconstruction technique. COMPARISON:  None Available.  FINDINGS: Lower chest: There is evidence of previous cardiac surgery. Hiatal hernia is seen. There are linear densities in left lower lung field suggesting scarring or subsegmental atelectasis. Left hemidiaphragm is elevated. Hepatobiliary: No focal abnormalities are seen in the liver. There is no dilation of bile ducts. Gallbladder stones are seen. Pancreas: No focal abnormalities are seen. Spleen: Unremarkable. Adrenals/Urinary Tract: Adrenals are unremarkable. There is left perinephric stranding. There is 1.9 x 1.1 cm fluid collection in the medial margin of left psoas close to the left renal pelvis. Fluid density structures are noted in the central portions of both kidneys, possibly suggesting parapelvic cysts. There is dilation of minor calyces in the left kidney suggesting moderate left hydronephrosis. Proximal left ureter is dilated. There is 5 mm calcific density in the left paraspinal region in the course of left ureter at L4 level. Distal left ureter is not dilated. Urinary bladder is not distended. There are small pockets of air in the urinary bladder. There are no demonstrable renal stones. Stomach/Bowel: There is small to moderate sized hiatal hernia. Stomach is unremarkable. Small bowel loops are not dilated. Appendix is difficult to visualize. In image 35 of series 5, there is a small caliber tubular structure with high density in the lumen close to the cecum, most likely normal appendix. There is high density in the lumen of cecum, possibly due to oral medication. There is no focal pericecal inflammation. There is no significant wall thickening in colon. There is no pericolic stranding. Vascular/Lymphatic: Scattered arterial calcifications are seen. Reproductive: Unremarkable. Other: There is no demonstrable pneumoperitoneum. There is no ascites. Musculoskeletal: There is minimal anterolisthesis at L5-S1 level. Last lumbar vertebra is transitional. IMPRESSION: Left kidney is larger than right with  significant left perinephric stranding. There is 5 mm calcific density in the proximal course of left ureter at L4 level, most likely calculus in the proximal left ureter causing moderate left hydronephrosis. Possibility of superimposed pyelonephritis should be considered. There are fluid density structures in the central portions in both kidneys, possibly  suggesting parapelvic cysts. There is 1.9 cm fluid collection between the lower pole of left kidney and psoas muscle. This may be part of perinephric edema due to pyelonephritis or high-grade ureteric obstruction or early abscess. There is no evidence of intestinal obstruction or pneumoperitoneum. There is no ascites. Hiatal hernia. Gallbladder stones. There are small pockets of air in the urinary bladder which may be due to recent catheterization or suggest cystitis. Other findings as described in the body of the report. Electronically Signed: By: Elmer Picker M.D. On: 02/07/2023 16:17    There are no new results to review at this time.  Assessment and Plan:  * Acute pyelonephritis Patient presenting with 3-day history of left flank and abdomen pain, fever, chills, nausea, vomiting with evidence of pyelonephritis on imaging complicated by hydronephrosis due to kidney stone.  Patient initially normotensive, however developed hypotension with tachycardia concerning for SIRS  - Cefepime per pharmacy dosing - Blood and urine cultures pending - S/p 1 L of IV fluids.  1 additional liter of fluids ordered with maintenance fluids afterwards - Telemetry monitoring - Tylenol as needed for fever  Nephrolithiasis Kidney stone seen on imaging with evidence of hydronephrosis.  - Urology consulted with plans for the OR today - N.p.o.  AKI (acute kidney injury) ([place]) Secondary to pyelonephritis with obstructive uropathy.  - IV fluids ordered - Repeat BMP in the a.m. - Avoid nephrotoxic agents  Uncontrolled type 2 diabetes mellitus with  hyperglycemia, without long-term current use of insulin (HCC) - Hold home antiglycemic agents - SSI, sensitive - A1c pending  Essential hypertension Patient is currently on propranolol only, which is likely for her anxiety, but however we will hold any antihypertensives at this time in the setting of low blood pressure.  - Restart home propranolol when able  History of CVA (cerebrovascular accident) - Resume home aspirin and Plavix tomorrow  Advance Care Planning:   Code Status: Full Code MOST form signed on chart with DNR/DNI, however goals of care discussion had with patient and she would want resuscitative efforts in the case of cardiac or pulmonary arrest.  Consults: Urology  Family Communication: Patient's daughter updated at bedside  Severity of Illness: The appropriate patient status for this patient is OBSERVATION. Observation status is judged to be reasonable and necessary in order to provide the required intensity of service to ensure the patient's safety. The patient's presenting symptoms, physical exam findings, and initial radiographic and laboratory data in the context of their medical condition is felt to place them at decreased risk for further clinical deterioration. Furthermore, it is anticipated that the patient will be medically stable for discharge from the hospital within 2 midnights of admission.   Author: Jose Persia, MD 02/07/2023 6:35 PM  For on call review [email or web address].

## 2023-02-07 NOTE — Consult Note (Signed)
Urology Consult   I have been asked to see the patient by Dr. Archie Balboa, for evaluation and management of left proximal ureteral stone, hydronephrosis, concern for UTI and sepsis from urinary source.  Chief Complaint: Left flank pain  HPI:  Kathleen Frazier is a 67 y.o. female who presented to the ER today with 3 to 4 days of left-sided flank pain, nausea, and malaise.  Evaluation in the ER showed a 5 mm left proximal ureteral stone with moderate to severe hydronephrosis and perinephric stranding.  Urinalysis was infected, leukocytosis to 26k, and AKI with creatinine 2.1, EGFR 25.  She denies any prior history of kidney stones.  Denies any chest pain or shortness of breath.  Temp 99.7, tachycardic to 110, hypotensive with blood pressure 85/57 concerning for sepsis from urinary source.  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   Tremor, essential     Surgical History: Past Surgical History:  Procedure Laterality Date   ARTERY BIOPSY Left 12/27/2022   Procedure: BIOPSY TEMPORAL ARTERY;  Surgeon: Algernon Huxley, MD;  Location: [facility] ORS;  Service: Vascular;  Laterality: Left;   BREAST CYST ASPIRATION  30+ years ago   CARDIAC VALVE REPLACEMENT     mitral valve replacement   COLONOSCOPY N/A 12/28/2013   Procedure: COLONOSCOPY;  Surgeon: Danie Binder, MD;  Location: [facility];  Service: Endoscopy;  Laterality: N/A;  10:45 AM   FRACTURE SURGERY     tib - fib right leg   right leg has rod and screws     TUBAL LIGATION      Allergies: No Known Allergies  Family History: Family History  Problem Relation Age of Onset   Stroke Mother 66       brain stem  stroke   Breast cancer Neg Hx     Social History:  reports that she has never smoked. She has never used smokeless tobacco. She reports current alcohol use. She reports that she does not use drugs.  ROS: Negative aside from those stated in the HPI.  Physical Exam: BP 100/78   Pulse (!) 102   Temp 97.8 F (36.6 C) (Oral)   Resp 18   Ht 5\' 2"  (1.575 m)   Wt 54 kg   SpO2 97%   BMI 21.77 kg/m    Constitutional:  Alert and oriented, No acute distress. Cardiovascular: Tachycardic, regular rhythm Respiratory: Clear to auscultation bilaterally GI: Abdomen is soft, nontender, nondistended, no abdominal masses   Laboratory Data: Reviewed, see HPI  Pertinent Imaging: I have personally reviewed the CT showing a 5 mm left proximal ureteral stone with hydronephrosis and perinephric stranding.  Assessment & Plan:   67 year old female with left-sided flank pain, nausea, and fevers at home with evaluation today showing a 5 mm left proximal ureteral stone with lab work and clinical picture concerning for UTI and sepsis from urinary source.  We discussed the need for drainage in the setting of an infected and obstructed system.  A ureteral stent is a small plastic tube that is  placed cystoscopically with one end in the kidney and the other end in the bladder that allows the infection from the kidney to drain, and relieves pain from the obstructing stone.  We discussed the risks at length including bleeding, infection, sepsis, death, ureteral injury, and stent related symptoms including urgency/frequency/dysuria/flank pain/gross hematuria.  There is a low, but not 0, risk of inability to pass the ureteral stent alongside the stone from below which would require percutaneous nephrostomy tube by interventional radiology.  Finally, we discussed possible prolonged hospitalization and recovery, possible temporary Foley catheter placement, and 10 to 14-day course of antibiotics.  We reviewed the need for  a follow-up procedure for definitive management of their stone when the infection has been treated in 2 to 3 weeks with either ureteroscopy/laser lithotripsy.  We discussed need for emergent stent placement in the setting of suspected sepsis from urinary source and risk of decline, as well as the risk of anesthesia with her n.p.o. status and Ozempic.  Recommendations: -Agree with hospitalist admission for antibiotics and resuscitation -OR tonight for cystoscopy and left ureteral stent placement -Discussed need for definitive treatment with ureteroscopy in 2 to 3 weeks after infection treated with both patient and family  Billey Co, MD  Total time spent on the floor was 60 minutes, with greater than 50% spent in counseling and coordination of care with the patient regarding infected left ureteral stone and need for emergent stent placement. [facility] [address], [place], [place] [address] [phone]

## 2023-02-07 NOTE — Anesthesia Postprocedure Evaluation (Signed)
Anesthesia Post Note  Patient: Kathleen Frazier  Procedure(s) Performed: LEFT CYSTOSCOPY WITH RETROGRADE PYELOGRAM,  AND STENT PLACEMENT (Left: Ureter)  Patient location during evaluation: PACU Anesthesia Type: General Level of consciousness: awake and alert Pain management: pain level controlled Vital Signs Assessment: post-procedure vital signs reviewed and stable Respiratory status: spontaneous breathing, nonlabored ventilation, respiratory function stable and patient connected to nasal cannula oxygen Cardiovascular status: blood pressure returned to baseline and stable Postop Assessment: no apparent nausea or vomiting Anesthetic complications: no   No notable events documented.   Last Vitals:  Vitals:   02/07/23 1900 02/07/23 1915  BP: (!) 93/57 (!) 93/58  Pulse: 100 100  Resp: (!) 22 19  Temp: (!) 38 C   SpO2: 94% 95%    Last Pain:  Vitals:   02/07/23 1900  TempSrc:   PainSc: 0-No pain                 Arita Miss

## 2023-02-07 NOTE — ED Triage Notes (Addendum)
Pt c/o left pelvic/LLQ pain and N/V/D x2 days. Pt last threw up last night. Pt denies CP, SHOB. Pt denies urinary s/s. Pt is AOX4. Pt reports taking ozempic x1 year on minimum dose.

## 2023-02-07 NOTE — Interval H&P Note (Signed)
History and Physical Interval Note:  02/07/2023 6:05 PM  Kathleen Frazier  has presented today for surgery, with the diagnosis of Infected stone.  The various methods of treatment have been discussed with the patient and family. After consideration of risks, benefits and other options for treatment, the patient has consented to  Procedure(s): [place], URETEROSCOPY AND STENT PLACEMENT (Left) as a surgical intervention.  The patient's history has been reviewed, patient examined, no change in status, stable for surgery.  I have reviewed the patient's chart and labs.  Questions were answered to the patient's satisfaction.     Billey Co

## 2023-02-07 NOTE — Assessment & Plan Note (Addendum)
Urology placed a stent in the left ureter.  Okay with removing Foley.  Patient urinated after Foley removal.  Will need outpatient follow-up for stone management.

## 2023-02-08 ENCOUNTER — Encounter: Payer: Self-pay | Admitting: Urology

## 2023-02-08 ENCOUNTER — Other Ambulatory Visit: Payer: Self-pay | Admitting: Urology

## 2023-02-08 DIAGNOSIS — B9620 Unspecified Escherichia coli [E. coli] as the cause of diseases classified elsewhere: Secondary | ICD-10-CM | POA: Diagnosis present

## 2023-02-08 DIAGNOSIS — A419 Sepsis, unspecified organism: Secondary | ICD-10-CM | POA: Diagnosis not present

## 2023-02-08 DIAGNOSIS — Z8673 Personal history of transient ischemic attack (TIA), and cerebral infarction without residual deficits: Secondary | ICD-10-CM | POA: Diagnosis not present

## 2023-02-08 DIAGNOSIS — Z7984 Long term (current) use of oral hypoglycemic drugs: Secondary | ICD-10-CM | POA: Diagnosis not present

## 2023-02-08 DIAGNOSIS — N201 Calculus of ureter: Secondary | ICD-10-CM

## 2023-02-08 DIAGNOSIS — Z823 Family history of stroke: Secondary | ICD-10-CM | POA: Diagnosis not present

## 2023-02-08 DIAGNOSIS — Z952 Presence of prosthetic heart valve: Secondary | ICD-10-CM | POA: Diagnosis not present

## 2023-02-08 DIAGNOSIS — Z7902 Long term (current) use of antithrombotics/antiplatelets: Secondary | ICD-10-CM | POA: Diagnosis not present

## 2023-02-08 DIAGNOSIS — N10 Acute tubulo-interstitial nephritis: Secondary | ICD-10-CM | POA: Diagnosis not present

## 2023-02-08 DIAGNOSIS — N200 Calculus of kidney: Secondary | ICD-10-CM | POA: Diagnosis not present

## 2023-02-08 DIAGNOSIS — M810 Age-related osteoporosis without current pathological fracture: Secondary | ICD-10-CM | POA: Diagnosis present

## 2023-02-08 DIAGNOSIS — I959 Hypotension, unspecified: Secondary | ICD-10-CM | POA: Insufficient documentation

## 2023-02-08 DIAGNOSIS — Z66 Do not resuscitate: Secondary | ICD-10-CM | POA: Diagnosis present

## 2023-02-08 DIAGNOSIS — Z7982 Long term (current) use of aspirin: Secondary | ICD-10-CM | POA: Diagnosis not present

## 2023-02-08 DIAGNOSIS — N136 Pyonephrosis: Secondary | ICD-10-CM | POA: Diagnosis present

## 2023-02-08 DIAGNOSIS — Z79899 Other long term (current) drug therapy: Secondary | ICD-10-CM | POA: Diagnosis not present

## 2023-02-08 DIAGNOSIS — E785 Hyperlipidemia, unspecified: Secondary | ICD-10-CM | POA: Diagnosis present

## 2023-02-08 DIAGNOSIS — D696 Thrombocytopenia, unspecified: Secondary | ICD-10-CM | POA: Diagnosis present

## 2023-02-08 DIAGNOSIS — Z87442 Personal history of urinary calculi: Secondary | ICD-10-CM | POA: Diagnosis not present

## 2023-02-08 DIAGNOSIS — F32A Depression, unspecified: Secondary | ICD-10-CM | POA: Diagnosis present

## 2023-02-08 DIAGNOSIS — G250 Essential tremor: Secondary | ICD-10-CM | POA: Diagnosis present

## 2023-02-08 DIAGNOSIS — K219 Gastro-esophageal reflux disease without esophagitis: Secondary | ICD-10-CM | POA: Diagnosis present

## 2023-02-08 DIAGNOSIS — I10 Essential (primary) hypertension: Secondary | ICD-10-CM | POA: Diagnosis present

## 2023-02-08 DIAGNOSIS — E1165 Type 2 diabetes mellitus with hyperglycemia: Secondary | ICD-10-CM | POA: Diagnosis not present

## 2023-02-08 DIAGNOSIS — N179 Acute kidney failure, unspecified: Secondary | ICD-10-CM | POA: Diagnosis not present

## 2023-02-08 DIAGNOSIS — Z9851 Tubal ligation status: Secondary | ICD-10-CM | POA: Diagnosis not present

## 2023-02-08 DIAGNOSIS — I951 Orthostatic hypotension: Secondary | ICD-10-CM

## 2023-02-08 DIAGNOSIS — F419 Anxiety disorder, unspecified: Secondary | ICD-10-CM | POA: Diagnosis present

## 2023-02-08 DIAGNOSIS — J302 Other seasonal allergic rhinitis: Secondary | ICD-10-CM | POA: Diagnosis present

## 2023-02-08 LAB — BASIC METABOLIC PANEL
Anion gap: 7 (ref 5–15)
BUN: 40 mg/dL — ABNORMAL HIGH (ref 8–23)
CO2: 21 mmol/L — ABNORMAL LOW (ref 22–32)
Calcium: 7.7 mg/dL — ABNORMAL LOW (ref 8.9–10.3)
Chloride: 110 mmol/L (ref 98–111)
Creatinine, Ser: 1.37 mg/dL — ABNORMAL HIGH (ref 0.44–1.00)
GFR, Estimated: 42 mL/min — ABNORMAL LOW (ref >60)
Glucose, Bld: 165 mg/dL — ABNORMAL HIGH (ref 70–99)
Potassium: 4.2 mmol/L (ref 3.5–5.1)
Sodium: 138 mmol/L (ref 135–145)

## 2023-02-08 LAB — CBC WITH DIFFERENTIAL/PLATELET
Abs Immature Granulocytes: 1.85 10*3/uL — ABNORMAL HIGH (ref 0.00–0.07)
Basophils Absolute: 0.1 10*3/uL (ref 0.0–0.1)
Basophils Relative: 1 %
Eosinophils Absolute: 0 10*3/uL (ref 0.0–0.5)
Eosinophils Relative: 0 %
HCT: 32.3 % — ABNORMAL LOW (ref 36.0–46.0)
Hemoglobin: 11 g/dL — ABNORMAL LOW (ref 12.0–15.0)
Immature Granulocytes: 11 %
Lymphocytes Relative: 4 %
Lymphs Abs: 0.7 10*3/uL (ref 0.7–4.0)
MCH: 32.6 pg (ref 26.0–34.0)
MCHC: 34.1 g/dL (ref 30.0–36.0)
MCV: 95.8 fL (ref 80.0–100.0)
Monocytes Absolute: 0.6 10*3/uL (ref 0.1–1.0)
Monocytes Relative: 3 %
Neutro Abs: 14.1 10*3/uL — ABNORMAL HIGH (ref 1.7–7.7)
Neutrophils Relative %: 81 %
Platelets: 139 10*3/uL — ABNORMAL LOW (ref 150–400)
RBC: 3.37 MIL/uL — ABNORMAL LOW (ref 3.87–5.11)
RDW: 13.1 % (ref 11.5–15.5)
Smear Review: NORMAL
WBC: 17.3 10*3/uL — ABNORMAL HIGH (ref 4.0–10.5)
nRBC: 0 % (ref 0.0–0.2)

## 2023-02-08 LAB — GLUCOSE, CAPILLARY
Glucose-Capillary: 146 mg/dL — ABNORMAL HIGH (ref 70–99)
Glucose-Capillary: 135 mg/dL — ABNORMAL HIGH (ref 70–99)
Glucose-Capillary: 110 mg/dL — ABNORMAL HIGH (ref 70–99)
Glucose-Capillary: 177 mg/dL — ABNORMAL HIGH (ref 70–99)
Glucose-Capillary: 184 mg/dL — ABNORMAL HIGH (ref 70–99)

## 2023-02-08 LAB — HEMOGLOBIN A1C
Hgb A1c MFr Bld: 6.8 % — ABNORMAL HIGH (ref 4.8–5.6)
Mean Plasma Glucose: 148 mg/dL

## 2023-02-08 MED ORDER — SODIUM CHLORIDE 0.9 % IV SOLN
2.0000 g | Freq: Two times a day (BID) | INTRAVENOUS | Status: DC
  Administered 2023-02-08 (×2): 2 g via INTRAVENOUS
  Filled 2023-02-08: qty 12.5
  Filled 2023-02-08: qty 2
  Filled 2023-02-08: qty 12.5

## 2023-02-08 MED ORDER — SODIUM CHLORIDE 0.9 % IV SOLN: INTRAVENOUS | Status: DC

## 2023-02-08 MED ORDER — ENOXAPARIN SODIUM 40 MG/0.4ML IJ SOSY
40.0000 mg | PREFILLED_SYRINGE | INTRAMUSCULAR | Status: DC
  Administered 2023-02-08: 40 mg via SUBCUTANEOUS
  Filled 2023-02-08 (×2): qty 0.4

## 2023-02-08 MED ORDER — CHLORHEXIDINE GLUCONATE CLOTH 2 % EX PADS
6.0000 | MEDICATED_PAD | Freq: Every day | CUTANEOUS | Status: DC
  Administered 2023-02-08: 6 via TOPICAL

## 2023-02-08 NOTE — Progress Notes (Signed)
Progress Note   Patient: Kathleen Frazier [id number] DOB: 04-15-1956 DOA: 02/07/2023     0 DOS: the patient was seen and examined on 02/08/2023   Brief hospital course: 67 y.o. female with medical history significant of type 2 diabetes, hypertension, mitral valve replacement, left optic nerve infarct, hyperlipidemia, who presents to the ED due to abdominal pain.   Kathleen Frazier states that on 02/04/2023, she began to develop left-sided abdominal and flank pain that was initially mild but continued to gradually worsen.  She then began to experience fevers with chills, nausea, vomiting and diarrhea.   Urology placed a left ureteral stent on 3/28.  3/29.  Patient still not feeling well.  Still having some pain in her left flank.  Continue IV antibiotics.  Await urine culture.   Assessment and Plan: * Acute pyelonephritis With leukocytosis.  Patient on Maxipime IV.  Follow-up urine and blood cultures.  White blood cell count trending better but still elevated at 17.3.  Nephrolithiasis Urology placed a stent in the left ureter.  Okay with removing Foley.  Patient urinated after Foley removal.  Will need outpatient follow-up for stone management.  AKI (acute kidney injury) ([place]) Creatinine improved from 2.10 down to 1.37  Uncontrolled type 2 diabetes mellitus with hyperglycemia, without long-term current use of insulin (HCC) Hemoglobin A1c pending.  Continue sliding scale insulin.  History of CVA (cerebrovascular accident) On aspirin and Plavix  Thrombocytopenia (HCC) Likely secondary to infection  Hypotension Likely secondary to infection.  Gentle IV fluids.   I had reviewed her ACP documents stating that she had a DNR in the past.  Conversation with patient and her daughter and she does not want to be a DNR anymore and is a full code.  Admitting physician ordered full code on admission but since she had documentation I needed to confirm this.     Subjective: Patient not feeling  well.  Still has some pain in her left side and back.  Admitted with pyelonephritis.  Also had urgent urological procedure for kidney stone with stent placement.  Physical Exam: Vitals:   02/07/23 1915 02/07/23 1958 02/07/23 1959 02/08/23 0740  BP: (!) 93/58 99/60 99/60  102/67  Pulse: 100 97 98 (!) 104  Resp: 19 18 19 17   Temp:  98.8 F (37.1 C) 98.8 F (37.1 C) 98.4 F (36.9 C)  TempSrc:  Oral    SpO2: 95% 96% 97% 95%  Weight:      Height:       Physical Exam HENT:     Head: Normocephalic.     Mouth/Throat:     Pharynx: No oropharyngeal exudate.  Eyes:     General: Lids are normal.     Conjunctiva/sclera: Conjunctivae normal.  Cardiovascular:     Rate and Rhythm: Normal rate and regular rhythm.     Heart sounds: Normal heart sounds, S1 normal and S2 normal.  Pulmonary:     Breath sounds: No decreased breath sounds, wheezing, rhonchi or rales.  Abdominal:     Palpations: Abdomen is soft.     Tenderness: There is abdominal tenderness in the left upper quadrant.  Musculoskeletal:     Right lower leg: No swelling.     Left lower leg: No swelling.  Skin:    General: Skin is warm.     Findings: No rash.  Neurological:     Mental Status: She is alert and oriented to person, place, and time.     Data Reviewed: Creatinine 1.37, white blood  cell count 17.3, platelet count 139, hemoglobin 11.0  Family Communication: Spoke with daughter on the phone  Disposition: Status is: Inpatient Remains inpatient appropriate because: Treating for acute pyelonephritis with IV antibiotics.  Awaiting urine culture  Planned Discharge Destination: Home    Time spent: 28 minutes Full CODE STATUS confirmed with patient and daughter at the bedside.  Author: Loletha Grayer, MD 02/08/2023 2:57 PM  For on call review [email or web address].

## 2023-02-08 NOTE — Consult Note (Signed)
Pharmacy Antibiotic Note  Kathleen Frazier is a 67 y.o. female with medical history including DM, HTN, mitral valve replacement, left optic nerve infract, HLD admitted on 02/07/2023 with  pyelonephritis / kidney stones . Patient taken emergently to OR 3/28. Pharmacy has been consulted for cefepime dosing.  Scr rapidly decreasing after stent placement. Admitted  with Scr 2.10 (BL 1.1-1.2), now Scr 1.37  Plan:  Increase Cefepime 2 g IV from q24hrs to q 12hrs  Continue to follow for renal dose adjustments. Monitor cultures and clinical course.  Height: 5\' 2"  (157.5 cm) Weight: 54 kg (119 lb) IBW/kg (Calculated) : 50.1  Temp (24hrs), Avg:99.2 F (37.3 C), Min:97.8 F (36.6 C), Max:100.4 F (38 C)  Recent Labs  Lab 02/07/23 1308 02/07/23 1935 02/08/23 0344  WBC 26.5*  --  17.3*  CREATININE 2.10*  --  1.37*  LATICACIDVEN  --  1.8  --      Estimated Creatinine Clearance: 31.5 mL/min (A) (by C-G formula based on SCr of 1.37 mg/dL (H)).    No Known Allergies  Antimicrobials this admission: Cefepime 3/28 >>   Microbiology results: 3/28 BCx: NG < 12hrs 3/28 Ucx: collected  Thank you for allowing pharmacy to be a part of this patient's care.  Dondre Catalfamo Rodriguez-Guzman PharmD, BCPS 02/08/2023 7:38 AM

## 2023-02-08 NOTE — Hospital Course (Signed)
67 y.o. female with medical history significant of type 2 diabetes, hypertension, mitral valve replacement, left optic nerve infarct, hyperlipidemia, who presents to the ED due to abdominal pain.   Ms. Mamola states that on 02/04/2023, she began to develop left-sided abdominal and flank pain that was initially mild but continued to gradually worsen.  She then began to experience fevers with chills, nausea, vomiting and diarrhea.   Urology placed a left ureteral stent on 3/28.  3/29.  Patient still not feeling well.  Still having some pain in her left flank.  Continue IV antibiotics.  Await urine culture. 3/30.  Urine culture growing E. coli.  Given a dose of Rocephin this morning.  Patient wants to go home and prescribe Keflex 3 times daily to complete a 14-day course.

## 2023-02-08 NOTE — Progress Notes (Signed)
PHARMACIST - PHYSICIAN COMMUNICATION  CONCERNING:  Enoxaparin (Lovenox) for DVT Prophylaxis    RECOMMENDATION: Patient was prescribed enoxaprin 30mg  q24 hours for VTE prophylaxis.   Filed Weights   02/07/23 1254 02/07/23 1738  Weight: 54 kg (119 lb) 54 kg (119 lb)    Body mass index is 21.77 kg/m.  Estimated Creatinine Clearance: 31.5 mL/min (A) (by C-G formula based on SCr of 1.37 mg/dL (H)).  Patient admitted with AKI and enoxaparin dose ordered from order set CrCL < 30. After procedure and hydration Scr rapidly improving from 2.10 on admission to 1.37 this morning.  Patient is candidate for enoxaparin 40mg  every 24 hours based on CrCl >60ml/min or Weight >45kg  DESCRIPTION: Pharmacy has adjusted enoxaparin dose per [facility] policy.  Patient is now receiving enoxaparin 40 mg every 24 hours   Hannan Tetzlaff Rodriguez-Guzman PharmD, BCPS 02/08/2023 7:32 AM

## 2023-02-08 NOTE — Progress Notes (Signed)
   02/08/23 0800  Spiritual Encounters  Type of Visit Initial  Care provided to: Patient  Conversation partners present during encounter Nurse  Referral source Patient request  Reason for visit Urgent spiritual support  OnCall Visit Yes  Spiritual Framework  Presenting Themes Meaning/purpose/sources of inspiration;Significant life change;Courage hope and growth  Patient Stress Factors Major life changes  Interventions  Spiritual Care Interventions Made Established relationship of care and support;Compassionate presence;Reflective listening;Normalization of emotions;Prayer;Encouragement  Spiritual Care Plan  Spiritual Care Issues Still Outstanding Chaplain will continue to follow   Prayer for patient going through a major life change. Patient was showing signs of fear, and anxiety dealing with her situation.

## 2023-02-08 NOTE — Assessment & Plan Note (Addendum)
Upon discharge platelet count up to 155.

## 2023-02-08 NOTE — Progress Notes (Signed)
   Subjective Afebrile since stent placement yesterday, feels significantly improved Mild left flank discomfort, significantly improved from prior  Physical Exam: BP 99/60 (BP Location: Left Arm)   Pulse 98   Temp 98.8 F (37.1 C)   Resp 19   Ht 5\' 2"  (1.575 m)   Wt 54 kg   SpO2 97%   BMI 21.77 kg/m    Constitutional:  Alert and oriented, No acute distress. Respiratory: Normal respiratory effort, no increased work of breathing. GI: Abdomen is soft, non-tender, non-distended Drains: Foley with pink urine  Laboratory Data: Reviewed, WBC and renal function improving   Assessment & Plan:   67 year old female who presented 02/07/2023 with fever, nausea, left-sided flank pain with a 5 mm left proximal ureteral stone and urinalysis/clinical picture concerning for infection/UTI/sepsis from urinary source.  POD#1 cystoscopy and left ureteral stent placement.  -Continue antibiotics, narrow as able pending cultures, recommend 10 to 14-day course total -Okay to remove Foley from a urology perspective today -Okay to start oxybutynin 10 mg XL daily if bladder spasms/urgency frequency from ureteral stent -Urology will coordinate outpatient scheduling of definitive treatment with ureteroscopy, laser lithotripsy, stent placement in 2 to 4 weeks  I spent 30 total minutes on the floor with greater than 50% spent with the patient and her daughter with counseling and coordination of care regarding infected left ureteral stone, stent related symptoms, need for definitive ureteroscopy in 2 to 4 weeks.   Billey Co, MD

## 2023-02-08 NOTE — Assessment & Plan Note (Signed)
Likely secondary to infection.  Gentle IV fluids.

## 2023-02-08 NOTE — Progress Notes (Unsigned)
Surgical Physician Order Form [facility] [place]  * Scheduling expectation :  2 to 4 weeks  *Length of Case: 1 hour  *Clearance needed: no  *Anticoagulation Instructions: May continue all anticoagulants  *Aspirin Instructions: Ok to continue all  *Post-op visit Date/Instructions:   tbd  *Diagnosis: Left Ureteral Stone  *Procedure: left  Ureteroscopy w/laser lithotripsy & stent exchange [id number])   Additional orders: N/A  -Admit type: OUTpatient  -Anesthesia: General  -VTE Prophylaxis Standing Order SCD's       Other:   -Standing Lab Orders Per Anesthesia    Lab other: UA&Urine Culture  -Standing Test orders EKG/Chest x-ray per Anesthesia       Test other:   - Medications:  Ancef 2gm IV  -Other orders:  N/A

## 2023-02-08 NOTE — Progress Notes (Signed)
Nutrition Brief Note  Patient identified on the Malnutrition Screening Tool (MST) Report  Wt Readings from Last 15 Encounters:  02/07/23 54 kg  01/10/23 55.4 kg  12/27/22 54 kg  12/21/22 54 kg  03/05/22 65.8 kg  01/08/22 66.7 kg  12/28/13 76.2 kg   Pt with medical history significant of type 2 diabetes, hypertension, mitral valve replacement, left optic nerve infarct, hyperlipidemia, who presents to the ED due to abdominal pain.   Pt admitted with acute pyelonephritis.   3/28- s/p cystoscopy  Reviewed I/O's: +1.3 L x 24 hours  UOP: 250 ml x 24 hours  Pt out of room at time of visit. Observed meal tray, which pt consumed 100%.   Reviewed wt hx; wt has been stable over the past 2 months. Noted distant history of weight loss. Per CareEverywhere, wt has 121# on 10/24/22. Pt very active, walks a mile daily and goes to yoga. MD considering discontinuing ozempic secondary to weight loss (which was likely contributing to weight loss).   Lab Results  Component Value Date   HGBA1C 7.6 (H) 03/05/2022   PTA DM medications are 500 mg metformin daily and 0.25 mg ozempic weekly.   Labs reviewed: CBGS: 110-135 (inpatient orders for glycemic control are 0-9 units insulin aspart TID with meals).    Current diet order is heart healthy/ carb modified, patient is consuming approximately 100% of meals at this time. Labs and medications reviewed.   No nutrition interventions warranted at this time. If nutrition issues arise, please consult RD.   Loistine Chance, RD, LDN, [place] Registered Dietitian II Certified Diabetes Care and Education Specialist Please refer to [facility] for RD and/or RD on-call/weekend/after hours pager

## 2023-02-09 DIAGNOSIS — N200 Calculus of kidney: Secondary | ICD-10-CM | POA: Diagnosis not present

## 2023-02-09 DIAGNOSIS — E1165 Type 2 diabetes mellitus with hyperglycemia: Secondary | ICD-10-CM | POA: Diagnosis not present

## 2023-02-09 DIAGNOSIS — I9589 Other hypotension: Secondary | ICD-10-CM

## 2023-02-09 DIAGNOSIS — N179 Acute kidney failure, unspecified: Secondary | ICD-10-CM | POA: Diagnosis not present

## 2023-02-09 DIAGNOSIS — N10 Acute tubulo-interstitial nephritis: Secondary | ICD-10-CM | POA: Diagnosis not present

## 2023-02-09 LAB — BASIC METABOLIC PANEL
Anion gap: 13 (ref 5–15)
BUN: 25 mg/dL — ABNORMAL HIGH (ref 8–23)
CO2: 20 mmol/L — ABNORMAL LOW (ref 22–32)
Calcium: 8.3 mg/dL — ABNORMAL LOW (ref 8.9–10.3)
Chloride: 103 mmol/L (ref 98–111)
Creatinine, Ser: 1.01 mg/dL — ABNORMAL HIGH (ref 0.44–1.00)
GFR, Estimated: >60 mL/min (ref >60)
Glucose, Bld: 161 mg/dL — ABNORMAL HIGH (ref 70–99)
Potassium: 3.7 mmol/L (ref 3.5–5.1)
Sodium: 136 mmol/L (ref 135–145)

## 2023-02-09 LAB — CBC
HCT: 33.8 % — ABNORMAL LOW (ref 36.0–46.0)
Hemoglobin: 11.3 g/dL — ABNORMAL LOW (ref 12.0–15.0)
MCH: 31.9 pg (ref 26.0–34.0)
MCHC: 33.4 g/dL (ref 30.0–36.0)
MCV: 95.5 fL (ref 80.0–100.0)
Platelets: 155 10*3/uL (ref 150–400)
RBC: 3.54 MIL/uL — ABNORMAL LOW (ref 3.87–5.11)
RDW: 13.1 % (ref 11.5–15.5)
WBC: 13.3 10*3/uL — ABNORMAL HIGH (ref 4.0–10.5)
nRBC: 0 % (ref 0.0–0.2)

## 2023-02-09 LAB — URINE CULTURE: Culture: >=100000 — AB

## 2023-02-09 LAB — GLUCOSE, CAPILLARY: Glucose-Capillary: 147 mg/dL — ABNORMAL HIGH (ref 70–99)

## 2023-02-09 MED ORDER — CEPHALEXIN 500 MG PO CAPS: 500.0000 mg | ORAL_CAPSULE | Freq: Three times a day (TID) | ORAL | 0 refills | Status: AC

## 2023-02-09 MED ORDER — OXYCODONE HCL 5 MG PO TABS: 5.0000 mg | ORAL_TABLET | Freq: Three times a day (TID) | ORAL | 0 refills | Status: AC | PRN

## 2023-02-09 MED ORDER — SODIUM CHLORIDE 0.9 % IV SOLN
2.0000 g | INTRAVENOUS | Status: DC
  Administered 2023-02-09: 2 g via INTRAVENOUS
  Filled 2023-02-09: qty 2

## 2023-02-09 NOTE — Plan of Care (Signed)

## 2023-02-09 NOTE — Discharge Summary (Signed)
Physician Discharge Summary   Patient: Kathleen Frazier MRN: [id number] DOB: 1955/12/03  Admit date:     02/07/2023  Discharge date: 02/09/23  Discharge Physician: Loletha Grayer   PCP: Rusty Aus, MD   Recommendations at discharge:   Follow-up Dr. Emily Filbert next week Dr. Doristine Counter office will call you to set up follow-up appointment  Discharge Diagnoses: Principal Problem:   Acute pyelonephritis Active Problems:   Nephrolithiasis   AKI (acute kidney injury) ([place])   Uncontrolled type 2 diabetes mellitus with hyperglycemia, without long-term current use of insulin (HCC)   History of CVA (cerebrovascular accident)   Hypotension   Thrombocytopenia [facility])    Hospital Course: 68 y.o. female with medical history significant of type 2 diabetes, hypertension, mitral valve replacement, left optic nerve infarct, hyperlipidemia, who presents to the ED due to abdominal pain.   Kathleen Frazier states that on 02/04/2023, she began to develop left-sided abdominal and flank pain that was initially mild but continued to gradually worsen.  She then began to experience fevers with chills, nausea, vomiting and diarrhea.   Urology placed a left ureteral stent on 3/28.  3/29.  Patient still not feeling well.  Still having some pain in her left flank.  Continue IV antibiotics.  Await urine culture. 3/30.  Urine culture growing E. coli.  Given a dose of Rocephin this morning.  Patient wants to go home and prescribe Keflex 3 times daily to complete a 14-day course.   Assessment and Plan: * Acute pyelonephritis With leukocytosis.  E. coli growing out of urine culture.  Initially on Maxipime.  Switched over to Rocephin.  Upon discharge we will complete 14 total days of treatment with the remaining days as outpatient on Keflex.  White blood cell count improved from 26.5 down to 13.3.  Nephrolithiasis Urology placed a stent in the left ureter on 3/28.Faythe Ghee with removing Foley.  Patient urinated after  Foley removal.  Will need outpatient follow-up for stone management.  The urology office will call you with a follow-up appointment.  AKI (acute kidney injury) ([place]) Creatinine improved from 2.10 down to 1.01  Uncontrolled type 2 diabetes mellitus with hyperglycemia, without long-term current use of insulin (HCC) Hemoglobin A1c 6.8.  Continue sliding scale insulin.  Can go back on oral medications and Ozempic once feeling better.  History of CVA (cerebrovascular accident) On aspirin and Plavix  Thrombocytopenia (HCC) Upon discharge platelet count up to 155.  Hypotension Likely secondary to infection, given fluids while here.  Held propranolol.  Patient has a blood pressure cuff at home.  Can restart if blood pressure starts rising.         Consultants: Urology Procedures performed: Left ureteral stent Disposition: Home Diet recommendation:  Cardiac and Carb modified diet DISCHARGE MEDICATION: Allergies as of 02/09/2023   No Known Allergies      Medication List     STOP taking these medications    ibuprofen 200 MG tablet Commonly known as: ADVIL   propranolol ER 160 MG SR capsule Commonly known as: INDERAL LA       TAKE these medications    acetaminophen 500 MG tablet Commonly known as: TYLENOL Take 1,000 mg by mouth every 6 (six) hours as needed.   aspirin 81 MG tablet Take 81 mg by mouth daily.   atorvastatin 40 MG tablet Commonly known as: LIPITOR Take 1 tablet (40 mg total) by mouth daily.   cephALEXin 500 MG capsule Commonly known as: KEFLEX Take 1 capsule (  500 mg total) by mouth 3 (three) times daily for 12 days. Start taking on: February 10, 2023   clopidogrel 75 MG tablet Commonly known as: PLAVIX Take 75 mg by mouth daily.   loratadine 10 MG tablet Commonly known as: CLARITIN Take 10 mg by mouth daily.   metFORMIN 500 MG tablet Commonly known as: GLUCOPHAGE Take 500 mg by mouth daily with supper.   multivitamin tablet Take 1 tablet by  mouth daily.   NON FORMULARY Calcium 600 + D    BID   oxyCODONE 5 MG immediate release tablet Commonly known as: Roxicodone Take 1 tablet (5 mg total) by mouth every 8 (eight) hours as needed for up to 3 days for severe pain.   Ozempic (0.25 or 0.5 MG/DOSE) 2 MG/3ML Sopn Generic drug: Semaglutide(0.25 or 0.5MG /DOS) SMARTSIG:0.25 Milligram(s) SUB-Q Once a Week   pantoprazole 40 MG tablet Commonly known as: PROTONIX Take 40 mg by mouth daily.   sertraline 100 MG tablet Commonly known as: ZOLOFT Take 100 mg by mouth daily. May take 100mg  or 1.5 tablet qhs        Follow-up Information     Rusty Aus, MD .   Specialty: Internal Medicine Contact information: [address] [place] [address] [phone]                Discharge Exam: Filed Weights   02/07/23 1254 02/07/23 1738  Weight: 54 kg 54 kg   Physical Exam HENT:     Head: Normocephalic.     Mouth/Throat:     Pharynx: No oropharyngeal exudate.  Eyes:     General: Lids are normal.     Conjunctiva/sclera: Conjunctivae normal.  Cardiovascular:     Rate and Rhythm: Normal rate and regular rhythm.     Heart sounds: Normal heart sounds, S1 normal and S2 normal.  Pulmonary:     Breath sounds: No decreased breath sounds, wheezing, rhonchi or rales.  Abdominal:     Palpations: Abdomen is soft.     Tenderness: There is abdominal tenderness in the left upper quadrant.  Musculoskeletal:     Right lower leg: No swelling.     Left lower leg: No swelling.  Skin:    General: Skin is warm.     Findings: No rash.  Neurological:     Mental Status: She is alert and oriented to person, place, and time.      Condition at discharge: stable  The results of significant diagnostics from this hospitalization (including imaging, microbiology, ancillary and laboratory) are listed below for reference.   Imaging Studies: DG OR UROLOGY CYSTO IMAGE ([facility] ONLY)  Result  Date: 02/07/2023 There is no interpretation for this exam.  This order is for images obtained during a surgical procedure.  Please See "Surgeries" Tab for more information regarding the procedure.   DG OR UROLOGY CYSTO IMAGE ([facility] ONLY)  Result Date: 02/07/2023 There is no interpretation for this exam.  This order is for images obtained during a surgical procedure.  Please See "Surgeries" Tab for more information regarding the procedure.   CT ABDOMEN PELVIS WO CONTRAST  Addendum Date: 02/07/2023   ADDENDUM REPORT: 02/07/2023 16:26 ADDENDUM: Imaging findings were related to the patient's treating provider by telephone call. Electronically Signed   By: Elmer Picker M.D.   On: 02/07/2023 16:26   Result Date: 02/07/2023 CLINICAL DATA:  Left-sided abdominal pain EXAM: CT ABDOMEN AND PELVIS WITHOUT CONTRAST TECHNIQUE: Multidetector CT imaging of the  abdomen and pelvis was performed following the standard protocol without IV contrast. RADIATION DOSE REDUCTION: This exam was performed according to the departmental dose-optimization program which includes automated exposure control, adjustment of the mA and/or kV according to patient size and/or use of iterative reconstruction technique. COMPARISON:  None Available. FINDINGS: Lower chest: There is evidence of previous cardiac surgery. Hiatal hernia is seen. There are linear densities in left lower lung field suggesting scarring or subsegmental atelectasis. Left hemidiaphragm is elevated. Hepatobiliary: No focal abnormalities are seen in the liver. There is no dilation of bile ducts. Gallbladder stones are seen. Pancreas: No focal abnormalities are seen. Spleen: Unremarkable. Adrenals/Urinary Tract: Adrenals are unremarkable. There is left perinephric stranding. There is 1.9 x 1.1 cm fluid collection in the medial margin of left psoas close to the left renal pelvis. Fluid density structures are noted in the central portions of both kidneys, possibly  suggesting parapelvic cysts. There is dilation of minor calyces in the left kidney suggesting moderate left hydronephrosis. Proximal left ureter is dilated. There is 5 mm calcific density in the left paraspinal region in the course of left ureter at L4 level. Distal left ureter is not dilated. Urinary bladder is not distended. There are small pockets of air in the urinary bladder. There are no demonstrable renal stones. Stomach/Bowel: There is small to moderate sized hiatal hernia. Stomach is unremarkable. Small bowel loops are not dilated. Appendix is difficult to visualize. In image 35 of series 5, there is a small caliber tubular structure with high density in the lumen close to the cecum, most likely normal appendix. There is high density in the lumen of cecum, possibly due to oral medication. There is no focal pericecal inflammation. There is no significant wall thickening in colon. There is no pericolic stranding. Vascular/Lymphatic: Scattered arterial calcifications are seen. Reproductive: Unremarkable. Other: There is no demonstrable pneumoperitoneum. There is no ascites. Musculoskeletal: There is minimal anterolisthesis at L5-S1 level. Last lumbar vertebra is transitional. IMPRESSION: Left kidney is larger than right with significant left perinephric stranding. There is 5 mm calcific density in the proximal course of left ureter at L4 level, most likely calculus in the proximal left ureter causing moderate left hydronephrosis. Possibility of superimposed pyelonephritis should be considered. There are fluid density structures in the central portions in both kidneys, possibly suggesting parapelvic cysts. There is 1.9 cm fluid collection between the lower pole of left kidney and psoas muscle. This may be part of perinephric edema due to pyelonephritis or high-grade ureteric obstruction or early abscess. There is no evidence of intestinal obstruction or pneumoperitoneum. There is no ascites. Hiatal hernia.  Gallbladder stones. There are small pockets of air in the urinary bladder which may be due to recent catheterization or suggest cystitis. Other findings as described in the body of the report. Electronically Signed: By: Elmer Picker M.D. On: 02/07/2023 16:17    Microbiology: Results for orders placed or performed during the hospital encounter of 02/07/23  Urine Culture (for pregnant, neutropenic or urologic patients or patients with an indwelling urinary catheter)     Status: Abnormal   Collection Time: 02/07/23  4:48 PM   Specimen: Urine, Clean Catch  Result Value Ref Range Status   Specimen Description   Final    URINE, CLEAN CATCH Performed at [facility], [address] [place], [place] [address]    Special Requests   Final    NONE Performed at [facility], [address] [place], [place] [address]  Culture >=100,000 COLONIES/mL ESCHERICHIA COLI (A)  Final   Report Status 02/09/2023 FINAL  Final   Organism ID, Bacteria ESCHERICHIA COLI (A)  Final      Susceptibility   Escherichia coli - MIC*    AMPICILLIN >=32 RESISTANT Resistant     CEFAZOLIN <=4 SENSITIVE Sensitive     CEFEPIME <=0.12 SENSITIVE Sensitive     CEFTRIAXONE <=0.25 SENSITIVE Sensitive     CIPROFLOXACIN <=0.25 SENSITIVE Sensitive     GENTAMICIN <=1 SENSITIVE Sensitive     IMIPENEM <=0.25 SENSITIVE Sensitive     NITROFURANTOIN <=16 SENSITIVE Sensitive     TRIMETH/SULFA <=20 SENSITIVE Sensitive     AMPICILLIN/SULBACTAM >=32 RESISTANT Resistant     PIP/TAZO <=4 SENSITIVE Sensitive     * >=100,000 COLONIES/mL ESCHERICHIA COLI  Culture, blood (Routine X 2) w Reflex to ID Panel     Status: None (Preliminary result)   Collection Time: 02/07/23  7:43 PM   Specimen: BLOOD  Result Value Ref Range Status   Specimen Description BLOOD LEFT ANTECUBITAL  Final   Special Requests   Final    BOTTLES DRAWN AEROBIC ONLY Blood Culture adequate volume   Culture   Final    NO GROWTH 2  DAYS Performed at [facility], [address] [place], [place] [address]    Report Status PENDING  Incomplete  Culture, blood (Routine X 2) w Reflex to ID Panel     Status: None (Preliminary result)   Collection Time: 02/07/23  7:54 PM   Specimen: BLOOD  Result Value Ref Range Status   Specimen Description BLOOD BLOOD RIGHT HAND  Final   Special Requests   Final    BOTTLES DRAWN AEROBIC AND ANAEROBIC Blood Culture adequate volume   Culture   Final    NO GROWTH 2 DAYS Performed at [facility], [address] [place], [place] [address]    Report Status PENDING  Incomplete    Labs: CBC: Recent Labs  Lab 02/07/23 1308 02/08/23 0344 02/09/23 0403  WBC 26.5* 17.3* 13.3*  NEUTROABS  --  14.1*  --   HGB 12.6 11.0* 11.3*  HCT 36.2 32.3* 33.8*  MCV 94.3 95.8 95.5  PLT 193 139* [id number]   Basic Metabolic Panel: Recent Labs  Lab 02/07/23 1308 02/08/23 0344 02/09/23 0403  NA 133* 138 136  K 4.3 4.2 3.7  CL 95* 110 103  CO2 22 21* 20*  GLUCOSE 215* 165* 161*  BUN 44* 40* 25*  CREATININE 2.10* 1.37* 1.01*  CALCIUM 9.3 7.7* 8.3*   Liver Function Tests: Recent Labs  Lab 02/07/23 1308  AST 41  ALT 21  ALKPHOS 44  BILITOT 1.1  PROT 7.2  ALBUMIN 4.2   CBG: Recent Labs  Lab 02/08/23 0733 02/08/23 1132 02/08/23 1613 02/08/23 2031 02/09/23 0751  GLUCAP 135* 110* 177* 184* 147*    Discharge time spent: greater than 30 minutes.  Signed: Loletha Grayer, MD [facility] 02/09/2023

## 2023-02-09 NOTE — Progress Notes (Signed)
Mobility Specialist - Progress Note   02/09/23 0857  Mobility  Activity Ambulated with assistance in hallway  Level of Assistance Standby assist, set-up cues, supervision of patient - no hands on  Assistive Device None  Distance Ambulated (ft) 160 ft  Activity Response Tolerated well  Mobility Referral Yes  $Mobility charge 1 Mobility   Pt standing at sink on RA upon arrival. Pt ambulates 1 lap around NS Supervision with no LOB noted. Pt returns to EOB with RN entering room.  Gretchen Short  Mobility Specialist  02/09/23 8:58 AM

## 2023-02-09 NOTE — Progress Notes (Signed)
MD order received in [facility] to discharge pt home today; verbally reviewed AVS with pt, no questions voiced at this time; pt discharged via wheelchair by nursing to [facility] 

## 2023-02-11 ENCOUNTER — Telehealth: Payer: Self-pay

## 2023-02-11 NOTE — Progress Notes (Signed)
    Urology[facility] Posting Form  Surgery Date: Date: 02/22/2023  Surgeon: Dr. Nickolas Madrid, MD  Inpt ( No  )   Outpt (Yes)   Obs ( No  )   Diagnosis: N20.1 Left Ureteral Stone  -CPT: [id number]  Surgery: Left Ureteroscopy with Laser Lithotripsy and Stent Exchange  Stop Anticoagulations: May continue all anticoagulants and ASA  Cardiac/Medical/Pulmonary Clearance needed: no  *Orders entered into EPIC  Date: 02/11/23   *Case booked in [place]  Date: 02/11/23  *Notified pt of Surgery: Date: 02/11/23  PRE-OP UA & CX: yes, will obtain in clinic on 02/15/2023  *Placed into Prior Authorization Work Fabio Bering Date: 02/11/23  Assistant/laser/rep:No  Patient has been advised to hold her Ozempic until after Surgery.

## 2023-02-11 NOTE — Telephone Encounter (Signed)
I spoke with Mrs. Kathleen Frazier. We have discussed possible surgery dates and Friday April 12th, 2024 was agreed upon by all parties. Patient given information about surgery date, what to expect pre-operatively and post operatively.  We discussed that a Pre-Admission Testing office will be calling to set up the pre-op visit that will take place prior to surgery, and that these appointments are typically done over the phone with a Pre-Admissions RN. Informed patient that our office will communicate any additional care to be provided after surgery. Patients questions or concerns were discussed during our call. Advised to call our office should there be any additional information, questions or concerns that arise. Patient verbalized understanding.

## 2023-02-12 LAB — CULTURE, BLOOD (ROUTINE X 2)
Culture: NO GROWTH
Culture: NO GROWTH
Special Requests: ADEQUATE
Special Requests: ADEQUATE

## 2023-02-13 IMAGING — MR MR HEAD W/ CM
7 series · 48 of 48 positions shown · IV contrast (6ml Gadavist)
Comparison: MRI head without contrast of the brain 03/05/2022

CLINICAL DATA: Vision loss. Right facial weakness. Question Bell's
palsy. Transient diplopia.

EXAM:
MRI HEAD WITH CONTRAST
TECHNIQUE: Multiplanar, multiecho pulse sequences of the brain and surrounding
structures were obtained with intravenous contrast.
CONTRAST:  6mL GADAVIST GADOBUTROL 1 MMOL/ML IV SOLN

[Series 5: ax dwi_tracew · axial · 3.0mm · 0.65mm/px · z∈[-137,+14]mm · 5 of 48 slices shown]
[im 1/48]
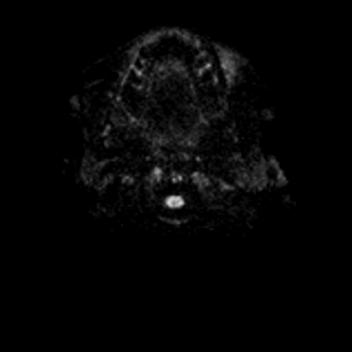
[im 12/48]
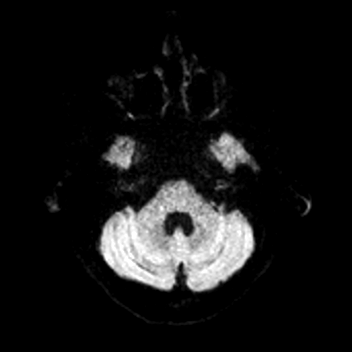
[im 24/48]
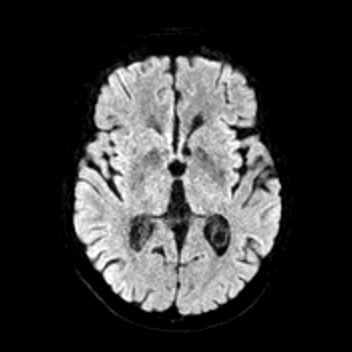
[im 36/48]
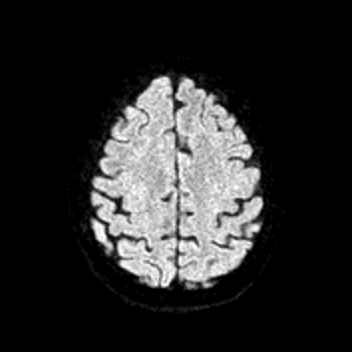
[im 48/48]
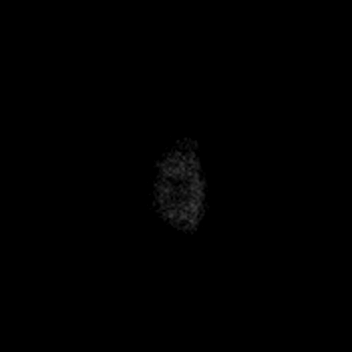

[Series 6: ax dwi_adc · axial · 3.0mm · 0.65mm/px · z∈[-137,+14]mm · 4 of 48 slices shown]
[im 1/48]
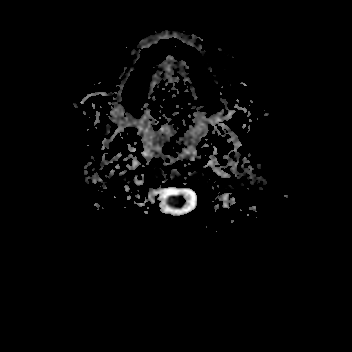
[im 16/48]
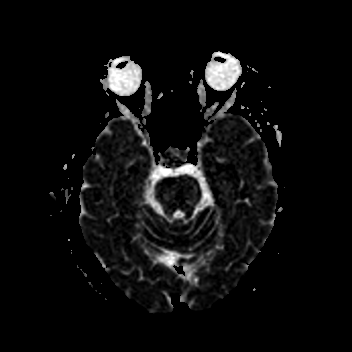
[im 32/48]
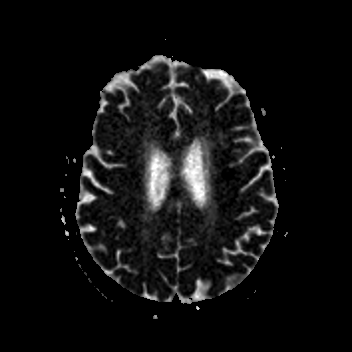
[im 48/48]
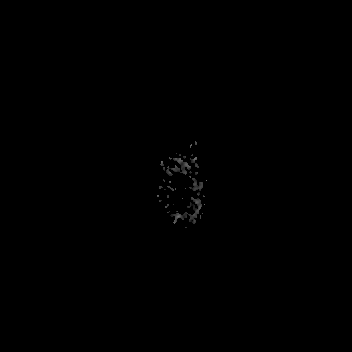

[Series 7: T1 · axial · 1.0mm · 0.98mm/px · z∈[-149,+21]mm · 16 of 176 slices shown]
[im 1/176]
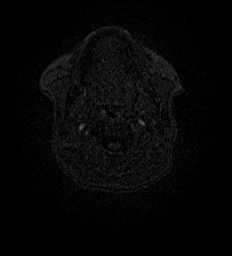
[im 12/176]
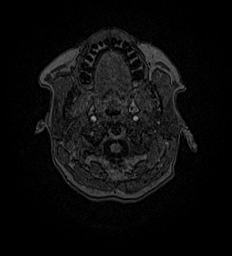
[im 24/176]
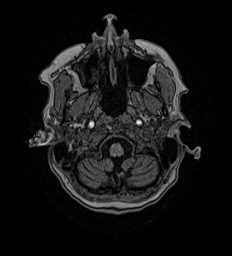
[im 36/176]
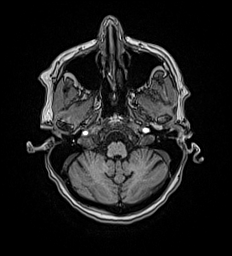
[im 47/176]
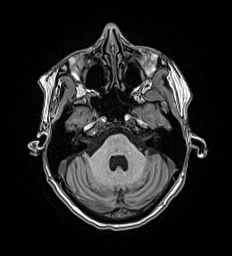
[im 59/176]
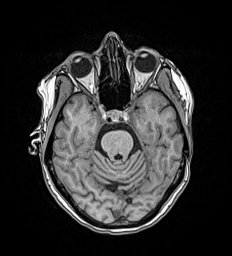
[im 71/176]
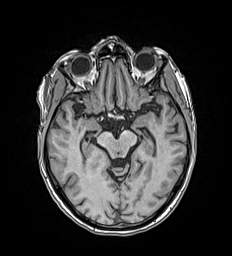
[im 82/176]
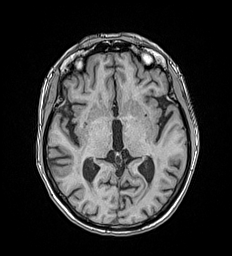
[im 94/176]
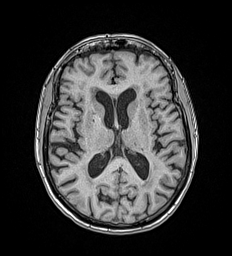
[im 106/176]
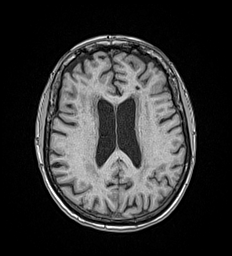
[im 117/176]
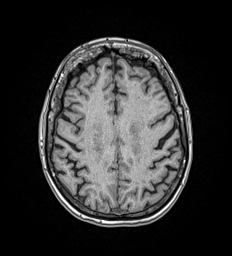
[im 129/176]
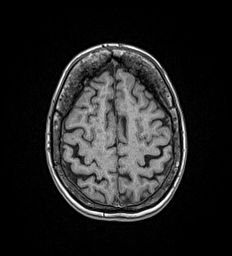
[im 141/176]
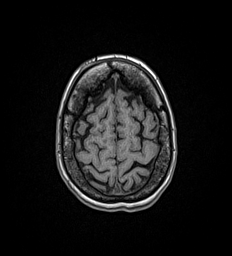
[im 152/176]
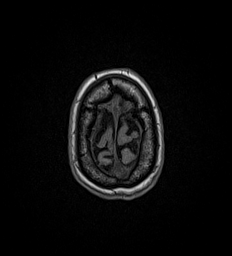
[im 164/176]
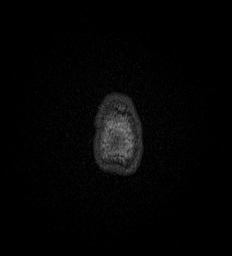
[im 176/176]
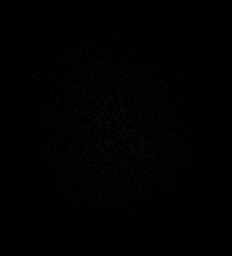

[Series 15: FLAIR · sagittal · 5.0mm · 0.94mm/px · 2 of 24 slices shown]
[im 1/24]
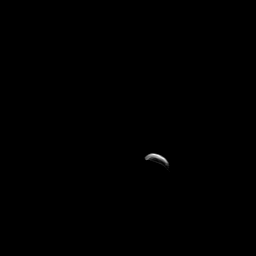
[im 24/24]
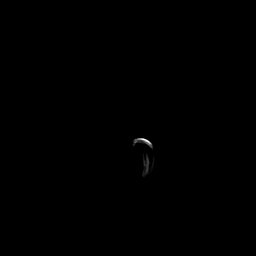

[Series 20: T1 post-contrast · axial · 1.0mm · 0.98mm/px · z∈[-149,+21]mm · 16 of 176 slices shown (1 of 3)]
[im 1/176]
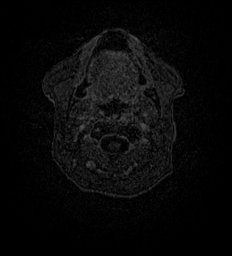
[im 12/176]
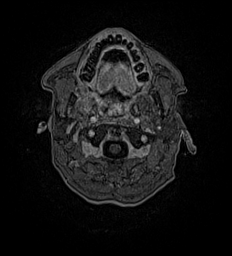
[im 24/176]
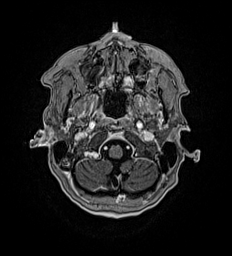
[im 36/176]
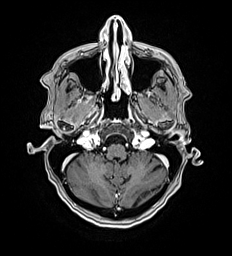
[im 47/176]
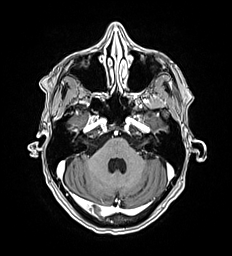
[im 59/176]
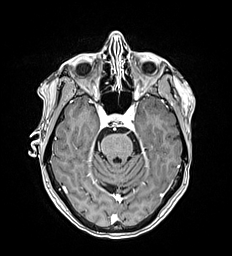
[im 71/176]
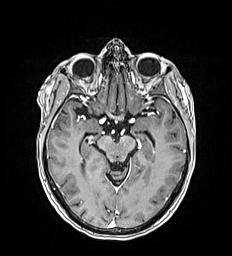
[im 82/176]
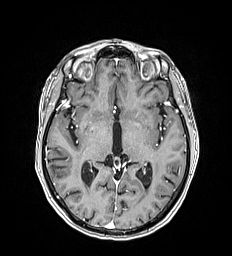
[im 94/176]
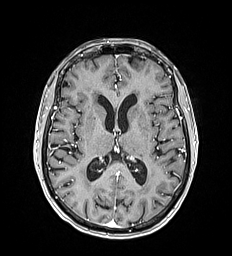
[im 106/176]
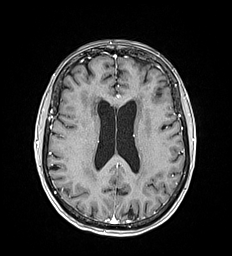
[im 117/176]
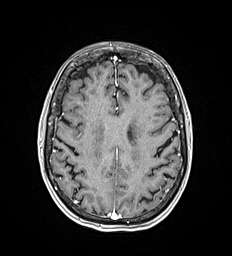
[im 129/176]
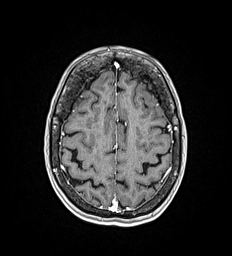
[im 141/176]
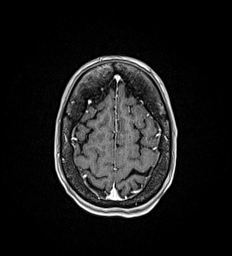
[im 152/176]
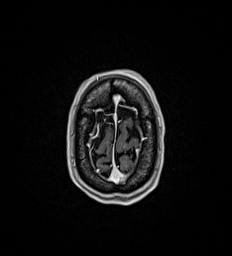
[im 164/176]
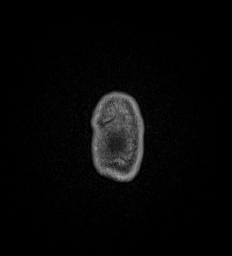
[im 176/176]
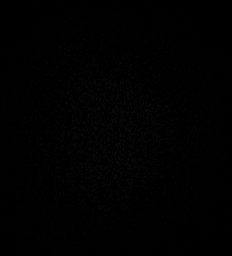

[Series 21: T1 post-contrast · coronal · 5.0mm · 0.57mm/px · 3 of 31 slices shown (2 of 3)]
[im 1/31]
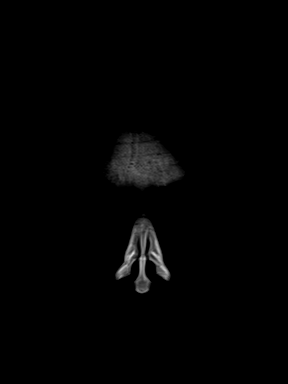
[im 16/31]
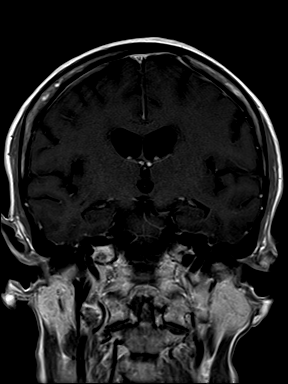
[im 31/31]
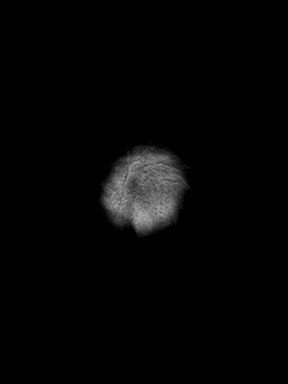

[Series 22: T1 post-contrast · sagittal · 5.0mm · 0.62mm/px · 2 of 25 slices shown (3 of 3)]
[im 1/25]
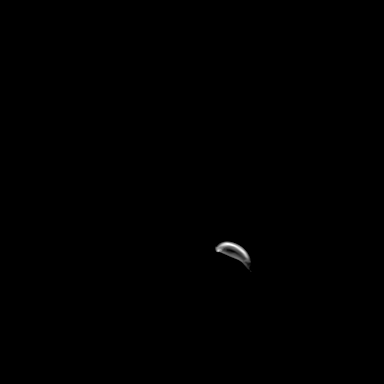
[im 25/25]
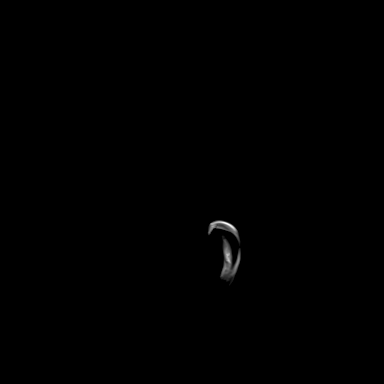

[48 of 48 positions shown; findings below may reference images not displayed]

FINDINGS: Brain: Postcontrast imaging of the brain was obtained. In addition,
diffusion-weighted imaging and sagittal FLAIR imaging performed.

Normal enhancement of the brain. No enhancing mass lesion. Normal
vascular enhancement.

Moderate white matter hyperintensity in the periventricular deep
white matter bilaterally as noted on yesterday's study. No areas of
restricted diffusion.
IMPRESSION: Normal enhancement of the brain

Moderate white matter changes likely due to chronic microvascular
ischemia.

## 2023-02-13 IMAGING — MR MR ORBITS WO/W CM
4 of 7 series · 25 of 48 positions shown · IV contrast (gadavist)
Comparison: No pertinent prior exam.

CLINICAL DATA: Vision loss right lower hemi field. Right facial
weakness. Question Bell's palsy

EXAM:
MRI OF THE ORBITS WITHOUT AND WITH CONTRAST
TECHNIQUE: Multiplanar, multi-echo pulse sequences of the orbits and
surrounding structures were acquired including fat saturation
techniques, before and after intravenous contrast administration.
CONTRAST:  6mL GADAVIST GADOBUTROL 1 MMOL/ML IV SOLN

[Series 8: T1 · sagittal · 3.0mm · 0.38mm/px · 8 of 40 slices shown (1 of 2)]
[im 1/40]
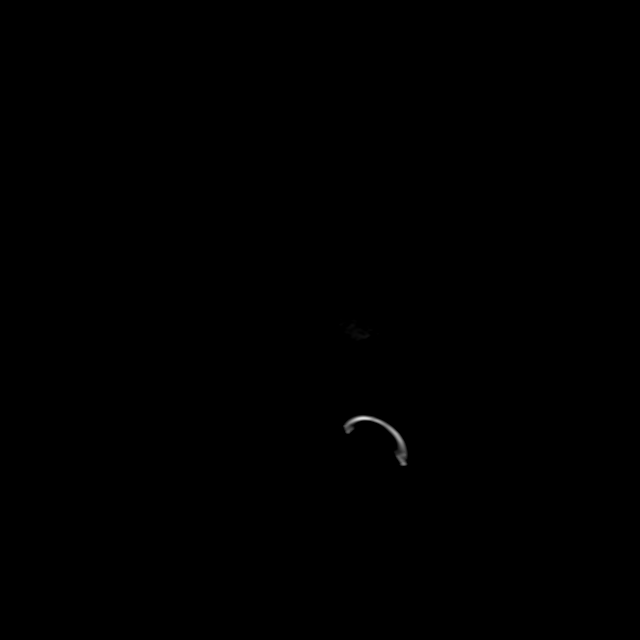
[im 6/40]
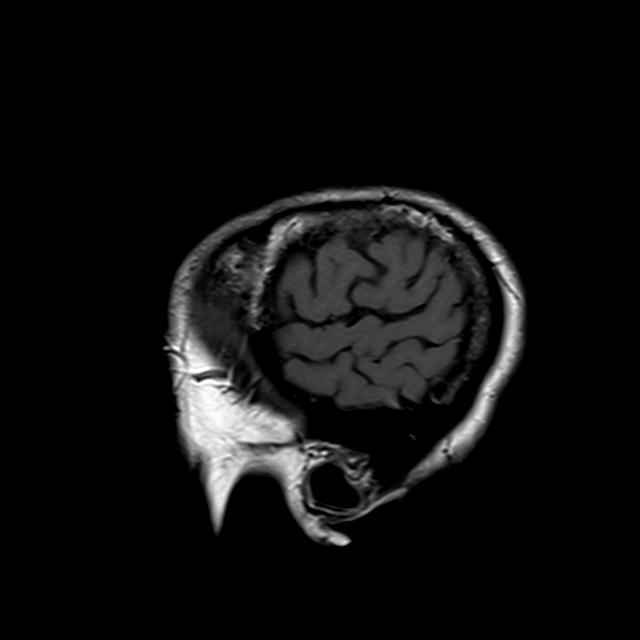
[im 12/40]
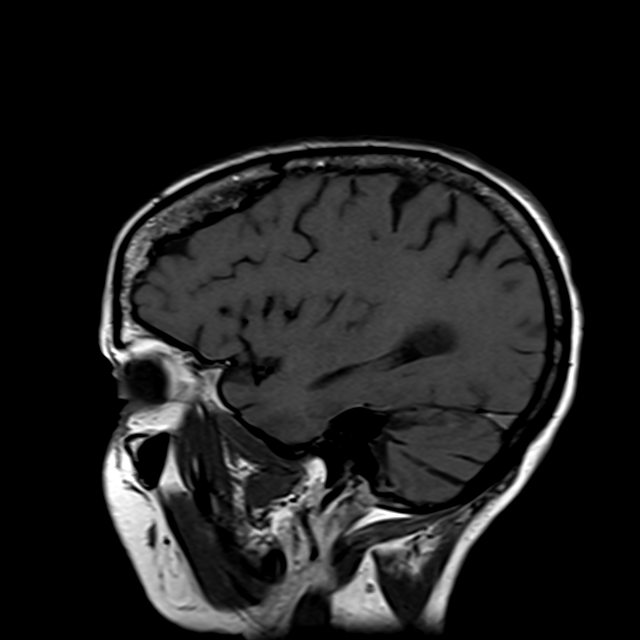
[im 17/40]
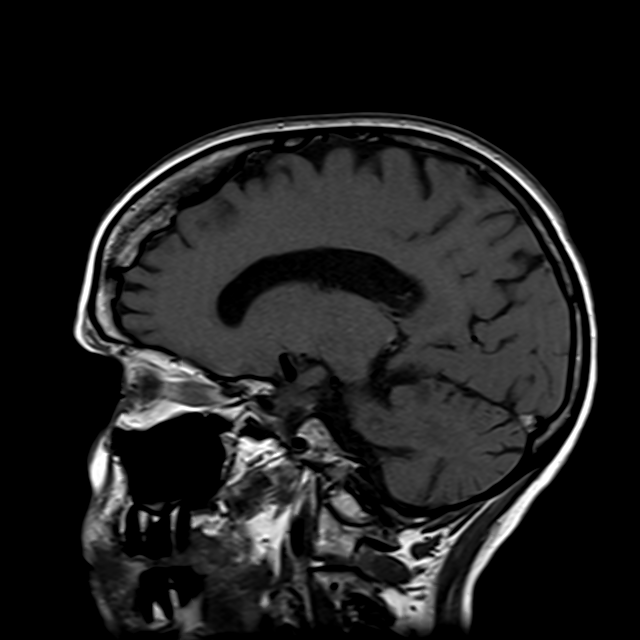
[im 23/40]
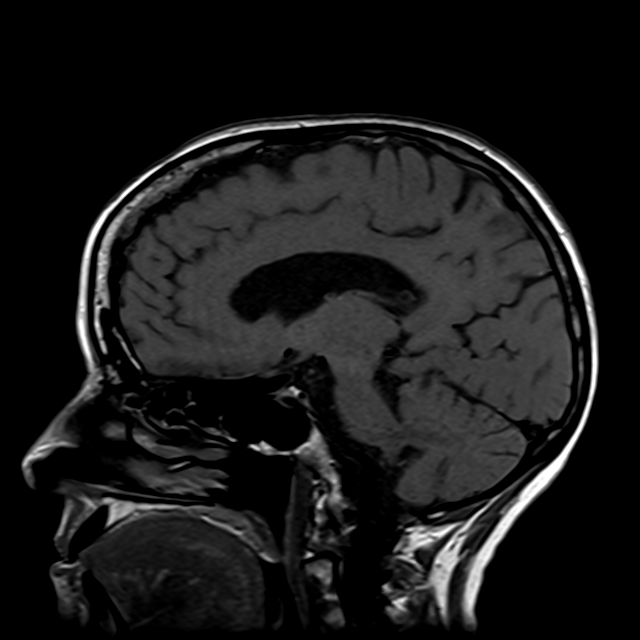
[im 28/40]
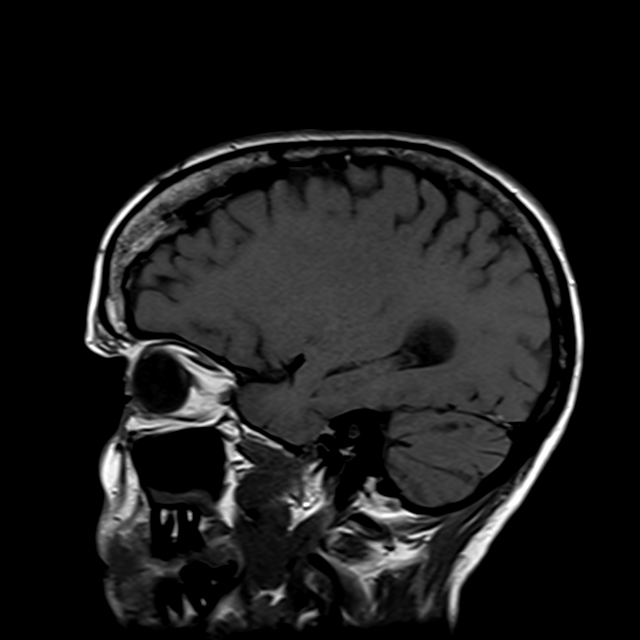
[im 34/40]
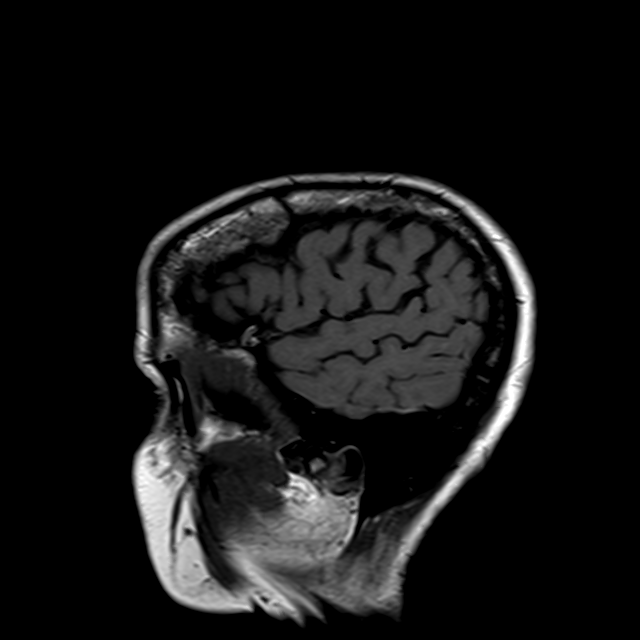
[im 40/40]
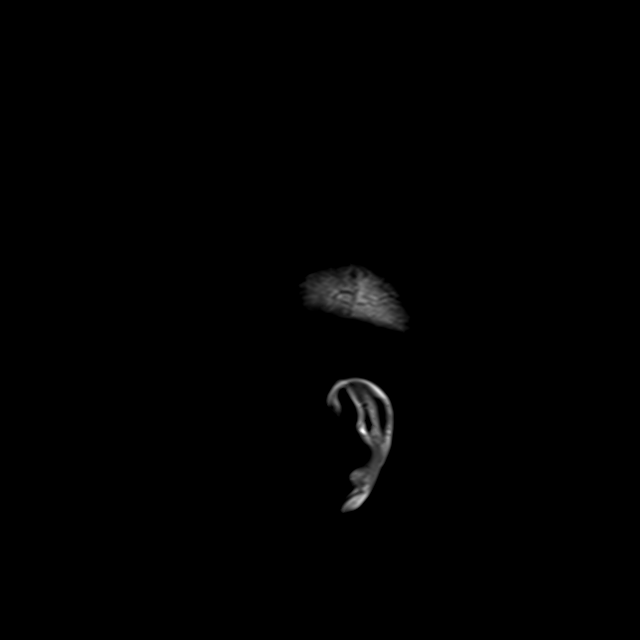

[Series 10: T2 · axial · 3.0mm · 0.78mm/px · z∈[-118,-58]mm · 4 of 17 slices shown (1 of 2)]
[im 1/17]
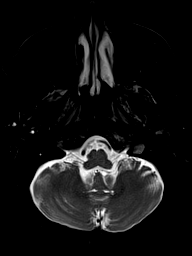
[im 6/17]
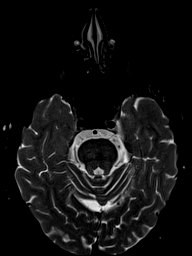
[im 11/17]
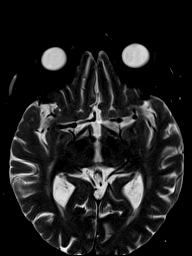
[im 17/17]
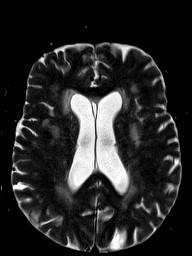

[Series 12: T2 · coronal · 3.0mm · 0.78mm/px · 8 of 35 slices shown (2 of 2)]
[im 1/35]
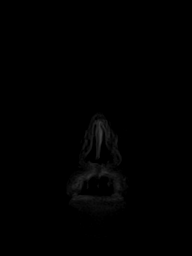
[im 5/35]
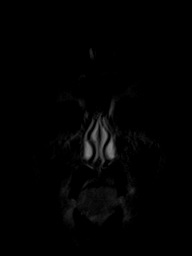
[im 10/35]
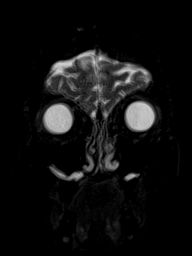
[im 15/35]
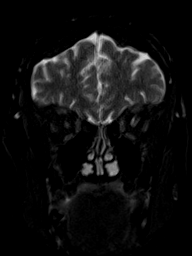
[im 20/35]
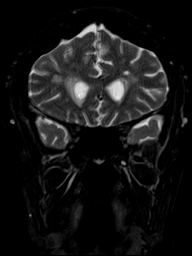
[im 25/35]
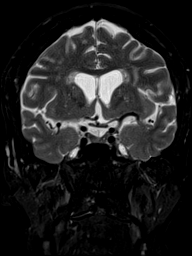
[im 30/35]
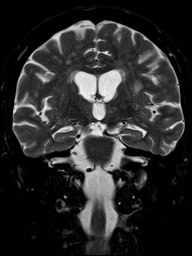
[im 35/35]
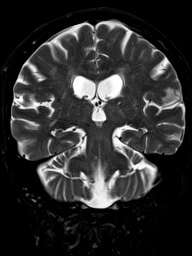

[Series 13: T1 · axial · non-contrast · 3.0mm · 0.31mm/px · z∈[-118,-55]mm · 5 of 23 slices shown (2 of 2)]
[im 1/23]
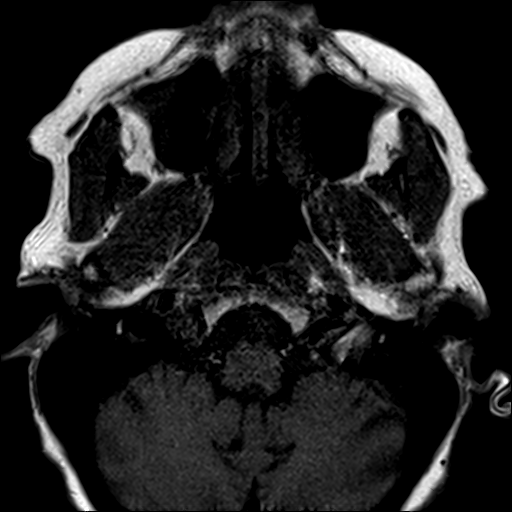
[im 6/23]
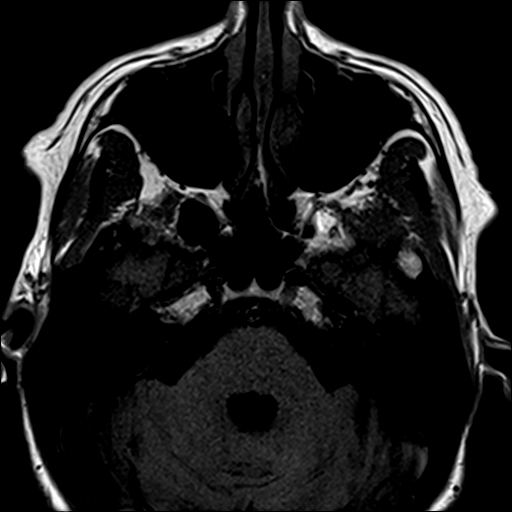
[im 12/23]
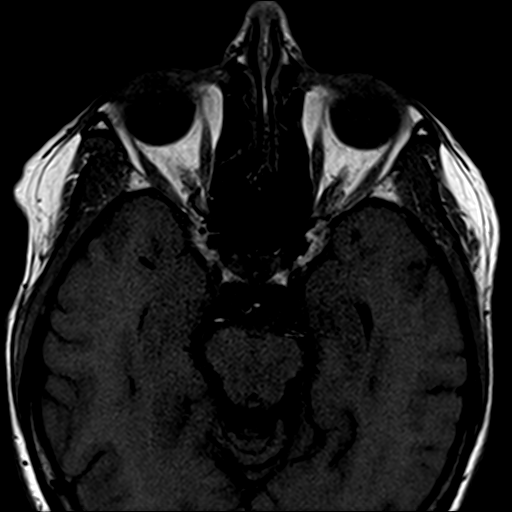
[im 17/23]
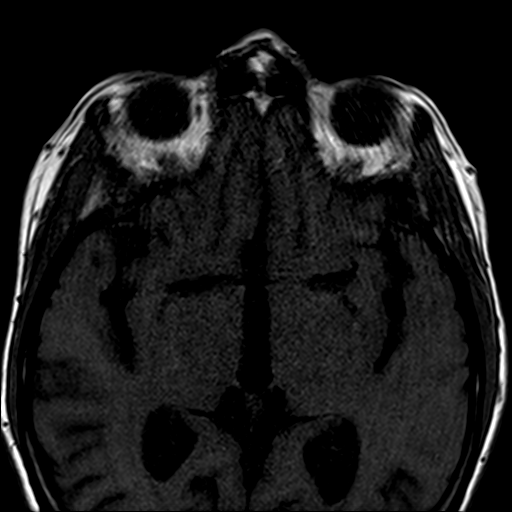
[im 23/23]
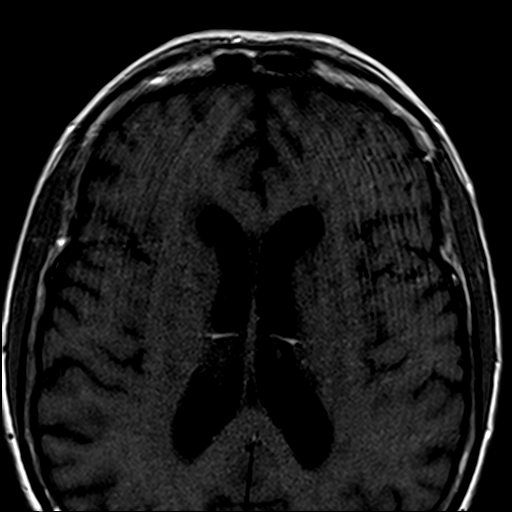

[25 of 48 positions shown; findings below may reference images not displayed]

FINDINGS: Orbits: Normal globe. Lens normal location. No orbital mass or
edema. Optic nerve normal in signal. No enhancing mass lesion.
Extraocular muscles normal. Orbital fat normal.

Optic chiasm normal. Pituitary not enlarged. Cavernous sinus normal
bilaterally.

Visualized sinuses: Negative

Soft tissues: Negative for soft tissue mass or edema

Limited intracranial: See MRI head result from yesterday.

Normal internal auditory canal. No abnormal enhancement in the
fundus.
IMPRESSION: Negative MRI of the orbit.  Normal optic nerve bilaterally.

## 2023-02-14 ENCOUNTER — Encounter
Admission: RE | Admit: 2023-02-14 | Discharge: 2023-02-14 | Disposition: A | Discharge status: Home / Self Care | Payer: Medicare PPO | Source: Ambulatory Visit | Attending: Urology | Admitting: Urology

## 2023-02-14 DIAGNOSIS — Z01818 Encounter for other preprocedural examination: Secondary | ICD-10-CM

## 2023-02-14 HISTORY — DX: Tubulo-interstitial nephritis, not specified as acute or chronic: N12

## 2023-02-14 HISTORY — DX: Other supraventricular tachycardia: I47.19

## 2023-02-14 HISTORY — DX: Unspecified visual loss: H54.7

## 2023-02-14 HISTORY — DX: Thrombocytopenia, unspecified: D69.6

## 2023-02-14 NOTE — Patient Instructions (Addendum)
Your procedure is scheduled on:02-22-23 Friday Report to the Registration Desk on the 1st floor of the [facility].Then proceed to the 2nd floor Surgery Desk To find out your arrival time, please call [phone] between 1PM - 3PM on:02-21-23 Thursday If your arrival time is 6:00 am, do not arrive before that time as the [place] entrance doors do not open until 6:00 am.  REMEMBER: Instructions that are not followed completely may result in serious medical risk, up to and including death; or upon the discretion of your surgeon and anesthesiologist your surgery may need to be rescheduled.  Do not eat food OR drink any liquids after midnight the night before surgery.  No gum chewing or hard candies.  One week prior to surgery: Stop Anti-inflammatories (NSAIDS) such as Advil, Aleve, Ibuprofen, Motrin, Naproxen, Naprosyn and Aspirin based products such as Excedrin, Goody's Powder, BC Powder.You may however, continue to take Tylenol/Oxycodone if needed for pain up until the day of surgery.  Stop ANY OVER THE COUNTER supplements/vitamins NOW (02-14-23) until after surgery (Multivitamin and Calcium +D)  Stop your metFORMIN (GLUCOPHAGE) 2 days prior to surgery-Last dose will be on 02-18-23 Tuesday  Stop your OZEMPIC 7 days prior to surgery-Last dose was on 02-02-23-Do NOT take again until AFTER your surgery  Continue your 81 mg Aspirin and clopidogrel (PLAVIX) up until the day prior to surgery as instructed by Dr Concepcion Elk NOT take the morning of surgery  TAKE ONLY THESE MEDICATIONS THE MORNING OF SURGERY WITH A SIP OF WATER: -loratadine (CLARITIN)  -pantoprazole (PROTONIX)-take one the night before and one on the morning of surgery - helps to prevent nausea after surgery.)  No Alcohol for 24 hours before or after surgery.  No Smoking including e-cigarettes for 24 hours before surgery.  No chewable tobacco products for at least 6 hours before surgery.  No nicotine patches on the day of  surgery.  Do not use any "recreational" drugs for at least a week (preferably 2 weeks) before your surgery.  Please be advised that the combination of cocaine and anesthesia may have negative outcomes, up to and including death. If you test positive for cocaine, your surgery will be cancelled.  On the morning of surgery brush your teeth with toothpaste and water, you may rinse your mouth with mouthwash if you wish. Do not swallow any toothpaste or mouthwash.  Do not wear jewelry, make-up, hairpins, clips or nail polish.  Do not wear lotions, powders, or perfumes.   Do not shave body hair from the neck down 48 hours before surgery.  Contact lenses, hearing aids and dentures may not be worn into surgery.  Do not bring valuables to the hospital. [facility] is not responsible for any missing/lost belongings or valuables.   Notify your doctor if there is any change in your medical condition (cold, fever, infection).  Wear comfortable clothing (specific to your surgery type) to the hospital.  After surgery, you can help prevent lung complications by doing breathing exercises.  Take deep breaths and cough every 1-2 hours. Your doctor may order a device called an Incentive Spirometer to help you take deep breaths. When coughing or sneezing, hold a pillow firmly against your incision with both hands. This is called "splinting." Doing this helps protect your incision. It also decreases belly discomfort.  If you are being admitted to the hospital overnight, leave your suitcase in the car. After surgery it may be brought to your room.  In case of increased patient census, it  may be necessary for you, the patient, to continue your postoperative care in the Same Day Surgery department.  If you are being discharged the day of surgery, you will not be allowed to drive home. You will need a responsible individual to drive you home and stay with you for 24 hours after surgery.   If you are taking  public transportation, you will need to have a responsible individual with you.  Please call the [facility] Dept. at [phone] if you have any questions about these instructions.  Surgery Visitation Policy:  Patients having surgery or a procedure may have two visitors.  Children under the age of 50 must have an adult with them who is not the patient.

## 2023-02-15 ENCOUNTER — Other Ambulatory Visit: Payer: Self-pay

## 2023-02-15 ENCOUNTER — Encounter: Payer: Self-pay | Admitting: Vascular Surgery

## 2023-02-15 DIAGNOSIS — N201 Calculus of ureter: Secondary | ICD-10-CM

## 2023-02-15 LAB — MICROSCOPIC EXAMINATION
Bacteria, UA: NONE SEEN
RBC, Urine: >30 /hpf — AB (ref 0–2)

## 2023-02-15 LAB — URINALYSIS, COMPLETE
Bilirubin, UA: NEGATIVE
Nitrite, UA: NEGATIVE
Specific Gravity, UA: 1.025 (ref 1.005–1.030)
Urobilinogen, Ur: 0.2 mg/dL (ref 0.2–1.0)
pH, UA: 5.5 (ref 5.0–7.5)

## 2023-02-20 LAB — CULTURE, URINE COMPREHENSIVE

## 2023-02-22 ENCOUNTER — Ambulatory Visit: Payer: Medicare PPO | Admitting: Urgent Care

## 2023-02-22 ENCOUNTER — Ambulatory Visit: Payer: Medicare PPO

## 2023-02-22 ENCOUNTER — Encounter
Admission: RE | Disposition: A | Discharge status: Home / Self Care | Payer: Self-pay | Source: Ambulatory Visit | Attending: Urology

## 2023-02-22 ENCOUNTER — Ambulatory Visit
Admission: RE | Admit: 2023-02-22 | Discharge: 2023-02-22 | Disposition: A | Discharge status: Home / Self Care | Payer: Medicare PPO | Source: Ambulatory Visit | Attending: Urology | Admitting: Urology

## 2023-02-22 ENCOUNTER — Other Ambulatory Visit: Payer: Self-pay

## 2023-02-22 DIAGNOSIS — E119 Type 2 diabetes mellitus without complications: Secondary | ICD-10-CM | POA: Diagnosis not present

## 2023-02-22 DIAGNOSIS — N201 Calculus of ureter: Secondary | ICD-10-CM | POA: Diagnosis present

## 2023-02-22 DIAGNOSIS — Z01818 Encounter for other preprocedural examination: Secondary | ICD-10-CM

## 2023-02-22 DIAGNOSIS — Z8673 Personal history of transient ischemic attack (TIA), and cerebral infarction without residual deficits: Secondary | ICD-10-CM | POA: Diagnosis not present

## 2023-02-22 DIAGNOSIS — Z7984 Long term (current) use of oral hypoglycemic drugs: Secondary | ICD-10-CM | POA: Diagnosis not present

## 2023-02-22 HISTORY — PX: CYSTOSCOPY/URETEROSCOPY/HOLMIUM LASER/STENT PLACEMENT: SHX6546

## 2023-02-22 LAB — GLUCOSE, CAPILLARY
Glucose-Capillary: 169 mg/dL — ABNORMAL HIGH (ref 70–99)
Glucose-Capillary: 184 mg/dL — ABNORMAL HIGH (ref 70–99)

## 2023-02-22 SURGERY — CYSTOSCOPY/URETEROSCOPY/HOLMIUM LASER/STENT PLACEMENT
Anesthesia: General | Laterality: Left

## 2023-02-22 MED ORDER — FAMOTIDINE 20 MG PO TABS
ORAL_TABLET | ORAL | Status: AC
  Filled 2023-02-22: qty 1

## 2023-02-22 MED ORDER — CEFAZOLIN SODIUM-DEXTROSE 2-4 GM/100ML-% IV SOLN
2.0000 g | INTRAVENOUS | Status: AC
  Administered 2023-02-22: 2 g via INTRAVENOUS

## 2023-02-22 MED ORDER — PROPOFOL 10 MG/ML IV BOLUS
INTRAVENOUS | Status: DC | PRN
  Administered 2023-02-22: 120 mg via INTRAVENOUS

## 2023-02-22 MED ORDER — MIDAZOLAM HCL 2 MG/2ML IJ SOLN
INTRAMUSCULAR | Status: AC
  Filled 2023-02-22: qty 2

## 2023-02-22 MED ORDER — CHLORHEXIDINE GLUCONATE 0.12 % MT SOLN
OROMUCOSAL | Status: AC
  Filled 2023-02-22: qty 15

## 2023-02-22 MED ORDER — ONDANSETRON HCL 4 MG/2ML IJ SOLN
INTRAMUSCULAR | Status: DC | PRN
  Administered 2023-02-22: 4 mg via INTRAVENOUS

## 2023-02-22 MED ORDER — FENTANYL CITRATE (PF) 100 MCG/2ML IJ SOLN
INTRAMUSCULAR | Status: AC
  Filled 2023-02-22: qty 2

## 2023-02-22 MED ORDER — ONDANSETRON HCL 4 MG/2ML IJ SOLN: 4.0000 mg | Freq: Once | INTRAMUSCULAR | Status: DC | PRN

## 2023-02-22 MED ORDER — CEFAZOLIN SODIUM-DEXTROSE 2-4 GM/100ML-% IV SOLN
INTRAVENOUS | Status: AC
  Filled 2023-02-22: qty 100

## 2023-02-22 MED ORDER — ORAL CARE MOUTH RINSE: 15.0000 mL | Freq: Once | OROMUCOSAL | Status: AC

## 2023-02-22 MED ORDER — FENTANYL CITRATE (PF) 100 MCG/2ML IJ SOLN
INTRAMUSCULAR | Status: DC | PRN
  Administered 2023-02-22: 50 ug via INTRAVENOUS

## 2023-02-22 MED ORDER — PHENYLEPHRINE 80 MCG/ML (10ML) SYRINGE FOR IV PUSH (FOR BLOOD PRESSURE SUPPORT)
PREFILLED_SYRINGE | INTRAVENOUS | Status: DC | PRN
  Administered 2023-02-22 (×2): 80 ug via INTRAVENOUS

## 2023-02-22 MED ORDER — SODIUM CHLORIDE 0.9 % IV SOLN: INTRAVENOUS | Status: DC

## 2023-02-22 MED ORDER — MIDAZOLAM HCL 2 MG/2ML IJ SOLN
INTRAMUSCULAR | Status: DC | PRN
  Administered 2023-02-22: 1 mg via INTRAVENOUS

## 2023-02-22 MED ORDER — FENTANYL CITRATE (PF) 100 MCG/2ML IJ SOLN: 25.0000 ug | INTRAMUSCULAR | Status: DC | PRN

## 2023-02-22 MED ORDER — IOHEXOL 180 MG/ML  SOLN
INTRAMUSCULAR | Status: DC | PRN
  Administered 2023-02-22: 10 mL

## 2023-02-22 MED ORDER — SODIUM CHLORIDE 0.9 % IR SOLN
Status: DC | PRN
  Administered 2023-02-22: 1000 mL via INTRAVESICAL

## 2023-02-22 MED ORDER — SUGAMMADEX SODIUM 200 MG/2ML IV SOLN
INTRAVENOUS | Status: DC | PRN
  Administered 2023-02-22: 200 mg via INTRAVENOUS

## 2023-02-22 MED ORDER — CHLORHEXIDINE GLUCONATE 0.12 % MT SOLN
15.0000 mL | Freq: Once | OROMUCOSAL | Status: AC
  Administered 2023-02-22: 15 mL via OROMUCOSAL

## 2023-02-22 MED ORDER — PROPOFOL 10 MG/ML IV BOLUS
INTRAVENOUS | Status: AC
  Filled 2023-02-22: qty 20

## 2023-02-22 MED ORDER — CEPHALEXIN 500 MG PO CAPS: 500.0000 mg | ORAL_CAPSULE | Freq: Every day | ORAL | 0 refills | Status: DC

## 2023-02-22 MED ORDER — DEXAMETHASONE SODIUM PHOSPHATE 10 MG/ML IJ SOLN
INTRAMUSCULAR | Status: DC | PRN
  Administered 2023-02-22: 8 mg via INTRAVENOUS

## 2023-02-22 MED ORDER — LIDOCAINE HCL (CARDIAC) PF 100 MG/5ML IV SOSY
PREFILLED_SYRINGE | INTRAVENOUS | Status: DC | PRN
  Administered 2023-02-22: 60 mg via INTRAVENOUS

## 2023-02-22 MED ORDER — ROCURONIUM BROMIDE 100 MG/10ML IV SOLN
INTRAVENOUS | Status: DC | PRN
  Administered 2023-02-22: 40 mg via INTRAVENOUS

## 2023-02-22 SURGICAL SUPPLY — 25 items
ADH LQ OCL WTPRF AMP STRL LF (MISCELLANEOUS) ×1
ADHESIVE MASTISOL STRL (MISCELLANEOUS) IMPLANT
BAG DRAIN SIEMENS DORNER NS (MISCELLANEOUS) ×1 IMPLANT
BAG DRN NS LF (MISCELLANEOUS) ×1
BAG PRESSURE INF REUSE 3000 (BAG) ×1 IMPLANT
BASKET ZERO TIP 1.9FR (BASKET) IMPLANT
BRUSH SCRUB EZ 1% IODOPHOR (MISCELLANEOUS) ×1 IMPLANT
BSKT STON RTRVL ZERO TP 1.9FR (BASKET) ×1
DRSG TEGADERM 2-3/8X2-3/4 SM (GAUZE/BANDAGES/DRESSINGS) IMPLANT
GLOVE BIOGEL PI IND STRL 7.5 (GLOVE) ×1 IMPLANT
GOWN STRL REUS W/ TWL LRG LVL3 (GOWN DISPOSABLE) ×1 IMPLANT
GOWN STRL REUS W/ TWL XL LVL3 (GOWN DISPOSABLE) ×1 IMPLANT
GOWN STRL REUS W/TWL LRG LVL3 (GOWN DISPOSABLE) ×1
GOWN STRL REUS W/TWL XL LVL3 (GOWN DISPOSABLE) ×1
GUIDEWIRE STR DUAL SENSOR (WIRE) ×1 IMPLANT
IV NS IRRIG 3000ML ARTHROMATIC (IV SOLUTION) ×1 IMPLANT
KIT TURNOVER CYSTO (KITS) ×1 IMPLANT
PACK CYSTO AR (MISCELLANEOUS) ×1 IMPLANT
SET CYSTO W/LG BORE CLAMP LF (SET/KITS/TRAYS/PACK) ×1 IMPLANT
STENT URET 6FRX24 CONTOUR (STENTS) IMPLANT
SURGILUBE 2OZ TUBE FLIPTOP (MISCELLANEOUS) ×1 IMPLANT
SYR 10ML LL (SYRINGE) ×1 IMPLANT
TRAP FLUID SMOKE EVACUATOR (MISCELLANEOUS) ×1 IMPLANT
VALVE UROSEAL ADJ ENDO (VALVE) IMPLANT
WATER STERILE IRR 500ML POUR (IV SOLUTION) ×1 IMPLANT

## 2023-02-22 NOTE — Discharge Instructions (Signed)
AMBULATORY SURGERY  ?DISCHARGE INSTRUCTIONS ? ? ?The drugs that you were given will stay in your system until tomorrow so for the next 24 hours you should not: ? ?Drive an automobile ?Make any legal decisions ?Drink any alcoholic beverage ? ? ?You may resume regular meals tomorrow.  Today it is better to start with liquids and gradually work up to solid foods. ? ?You may eat anything you prefer, but it is better to start with liquids, then soup and crackers, and gradually work up to solid foods. ? ? ?Please notify your doctor immediately if you have any unusual bleeding, trouble breathing, redness and pain at the surgery site, drainage, fever, or pain not relieved by medication. ? ? ? ?Additional Instructions: ? ? ? ?Please contact your physician with any problems or Same Day Surgery at [phone], Monday through Friday 6 am to 4 pm, or [facility] Main number at [phone].  ?

## 2023-02-22 NOTE — Anesthesia Postprocedure Evaluation (Signed)
Anesthesia Post Note  Patient: Kathleen Frazier  Procedure(s) Performed: CYSTOSCOPY/URETEROSCOPY/HOLMIUM LASER/STENT EXCHANGE (Left)  Patient location during evaluation: PACU Anesthesia Type: General Level of consciousness: awake and oriented Pain management: pain level controlled Vital Signs Assessment: post-procedure vital signs reviewed and stable Respiratory status: spontaneous breathing and respiratory function stable Cardiovascular status: blood pressure returned to baseline Anesthetic complications: no   No notable events documented.   Last Vitals:  Vitals:   02/22/23 1315 02/22/23 1330  BP: 127/78 122/77  Pulse: 78 78  Resp: 12 13  Temp:    SpO2: 96% 96%    Last Pain:  Vitals:   02/22/23 1330  TempSrc:   PainSc: 0-No pain                 VAN STAVEREN,Illianna Paschal

## 2023-02-22 NOTE — Anesthesia Procedure Notes (Signed)
Procedure Name: Intubation Date/Time: 02/22/2023 12:10 PM  Performed by: Karoline Caldwell, CRNAPre-anesthesia Checklist: Patient identified, Patient being monitored, Timeout performed, Emergency Drugs available and Suction available Patient Re-evaluated:Patient Re-evaluated prior to induction Oxygen Delivery Method: Circle system utilized Preoxygenation: Pre-oxygenation with 100% oxygen Induction Type: IV induction Ventilation: Mask ventilation without difficulty Laryngoscope Size: 3 and McGraph Grade View: Grade I Tube type: Oral Tube size: 6.5 mm Number of attempts: 1 Airway Equipment and Method: Stylet Placement Confirmation: ETT inserted through vocal cords under direct vision, positive ETCO2 and breath sounds checked- equal and bilateral Secured at: 20 cm Tube secured with: Tape Dental Injury: Teeth and Oropharynx as per pre-operative assessment

## 2023-02-22 NOTE — Transfer of Care (Signed)
Immediate Anesthesia Transfer of Care Note  Patient: Alexyz Gut  Procedure(s) Performed: CYSTOSCOPY/URETEROSCOPY/HOLMIUM LASER/STENT EXCHANGE (Left)  Patient Location: PACU  Anesthesia Type:General  Level of Consciousness: awake, alert , and oriented  Airway & Oxygen Therapy: Patient Spontanous Breathing  Post-op Assessment: Report given to RN and Post -op Vital signs reviewed and stable  Post vital signs: Reviewed and stable  Last Vitals:  Vitals Value Taken Time  BP 145/81 02/22/23 1240  Temp    Pulse 77 02/22/23 1242  Resp 16 02/22/23 1242  SpO2 99 % 02/22/23 1242  Vitals shown include unvalidated device data.  Last Pain:  Vitals:   02/22/23 1126  TempSrc: Temporal  PainSc: 0-No pain         Complications: No notable events documented.

## 2023-02-22 NOTE — Anesthesia Preprocedure Evaluation (Signed)
Anesthesia Evaluation  Patient identified by MRN, date of birth, ID band Patient awake    Reviewed: Allergy & Precautions, NPO status , Patient's Chart, lab work & pertinent test results  Airway Mallampati: II  TM Distance: >3 FB Neck ROM: Full    Dental  (+) Teeth Intact   Pulmonary neg pulmonary ROS, Patient abstained from smoking.   Pulmonary exam normal  + decreased breath sounds      Cardiovascular Exercise Tolerance: Good hypertension, Pt. on medications negative cardio ROS Normal cardiovascular exam Rhythm:Regular  S/P Mitral valve repair   Neuro/Psych  Headaches  Anxiety Depression    CVA negative neurological ROS  negative psych ROS   GI/Hepatic negative GI ROS, Neg liver ROS, hiatal hernia,GERD  Medicated,,  Endo/Other  negative endocrine ROSdiabetes, Type 2, Oral Hypoglycemic Agents    Renal/GU Renal InsufficiencyRenal disease  negative genitourinary   Musculoskeletal  (+) Arthritis ,    Abdominal Normal abdominal exam  (+)   Peds negative pediatric ROS (+)  Hematology negative hematology ROS (+)   Anesthesia Other Findings Past Medical History: No date: Anxiety No date: Arthritis No date: Atrial tachycardia No date: Depression No date: Diabetes mellitus without complication No date: GERD (gastroesophageal reflux disease) No date: H/O hiatal hernia No date: Headache No date: Hyperlipemia No date: Hypertension No date: Neuromuscular disorder No date: Osteoporosis No date: Pneumonia No date: Pyelonephritis No date: Seasonal allergies No date: 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 No date: Thrombocytopenia No date: Tremor, essential No date: Visual impairment     Comment:  Right eye infarct  Past Surgical History: 12/27/2022: ARTERY BIOPSY; Left     Comment:  Procedure: BIOPSY TEMPORAL ARTERY;  Surgeon: Annice Needy, MD;  Location: [facility];  Service: Vascular;                 Laterality: Left; 30+ years ago: BREAST CYST ASPIRATION 2006: CARDIAC VALVE REPLACEMENT     Comment:  mitral valve replacement 12/28/2013: COLONOSCOPY; N/A     Comment:  Procedure: COLONOSCOPY;  Surgeon: West Bali, MD;                [facility];  Service: Endoscopy;                Laterality: N/A;  10:45 AM 02/07/2023: CYSTOSCOPY WITH RETROGRADE PYELOGRAM, URETEROSCOPY AND  STENT PLACEMENT; Left     Comment:  Procedure: LEFT CYSTOSCOPY WITH RETROGRADE PYELOGRAM,                AND STENT PLACEMENT;  Surgeon: Sondra Come, MD;                Location: [facility];  Service: Urology;  Laterality: Left; No date: FRACTURE SURGERY     Comment:  tib - fib right leg No date: right leg has rod and screws No date: TUBAL LIGATION     Reproductive/Obstetrics negative OB ROS                             Anesthesia Physical Anesthesia Plan  ASA: 3  Anesthesia Plan: General   Post-op Pain Management:    Induction: Intravenous  PONV Risk Score and Plan: 1 and Ondansetron and Dexamethasone  Airway Management Planned: Oral ETT  Additional Equipment:   Intra-op Plan:   Post-operative  Plan: Extubation in OR  Informed Consent: I have reviewed the patients History and Physical, chart, labs and discussed the procedure including the risks, benefits and alternatives for the proposed anesthesia with the patient or authorized representative who has indicated his/her understanding and acceptance.     Dental Advisory Given  Plan Discussed with: CRNA and Surgeon  Anesthesia Plan Comments:        Anesthesia Quick Evaluation

## 2023-02-22 NOTE — H&P (Signed)
   02/22/23 11:32 AM   Kathleen Frazier Jun 05, 1956 [id number]  CC: Left ureteral stone  HPI: 67 year old female who previously presented with 5 mm left proximal ureteral stone and sepsis from urinary source and underwent emergent stent placement.  Presents today for definitive management after treatment with antibiotics.  Urine culture ultimately grew E. coli.  She is tolerated the stent well.  PMH: Past Medical History:  Diagnosis Date   Anxiety    Arthritis    Atrial tachycardia    Depression    Diabetes mellitus without complication    GERD (gastroesophageal reflux disease)    H/O hiatal hernia    Headache    Hyperlipemia    Hypertension    Neuromuscular disorder    Osteoporosis    Pneumonia    Pyelonephritis    Seasonal allergies    Stroke    x 2 Optic nevre cvas   Thrombocytopenia    Tremor, essential    Visual impairment    Right eye infarct    Surgical History: Past Surgical History:  Procedure Laterality Date   ARTERY BIOPSY Left 12/27/2022   Procedure: BIOPSY TEMPORAL ARTERY;  Surgeon: Annice Needy, MD;  Location: [facility];  Service: Vascular;  Laterality: Left;   BREAST CYST ASPIRATION  30+ years ago   CARDIAC VALVE REPLACEMENT  2006   mitral valve replacement   COLONOSCOPY N/A 12/28/2013   Procedure: COLONOSCOPY;  Surgeon: West Bali, MD;  [facility];  Service: Endoscopy;  Laterality: N/A;  10:45 AM   CYSTOSCOPY WITH RETROGRADE PYELOGRAM, URETEROSCOPY AND STENT PLACEMENT Left 02/07/2023   Procedure: LEFT CYSTOSCOPY WITH RETROGRADE PYELOGRAM,  AND STENT PLACEMENT;  Surgeon: Sondra Come, MD;  Location: [facility];  Service: Urology;  Laterality: Left;   FRACTURE SURGERY     tib - fib right leg   right leg has rod and screws     TUBAL LIGATION     Family History: Family History  Problem Relation Age of Onset   Stroke Mother 90       brain stem stroke   Breast cancer Neg Hx     Social History:  reports that she has never smoked.  She has never used smokeless tobacco. She reports current alcohol use. She reports that she does not use drugs.  Physical Exam: Pulse 98   Temp 97.9 F (36.6 C) (Temporal)   SpO2 98%    Constitutional:  Alert and oriented, No acute distress. Cardiovascular: Regular rate and rhythm. Respiratory: Clear to auscultation bilaterally GI: Abdomen is soft, nontender, nondistended, no abdominal masses   Laboratory Data: Repeat urine culture 4/5 no growth  Assessment & Plan:   67 year old female who previously presented with a 5 mm left proximal ureteral stone and sepsis from urinary source and underwent urgent stent placement for drainage at that time, has been treated with culture appropriate antibiotics and presents today for definitive stone removal with ureteroscopy.  We specifically discussed the risks ureteroscopy including bleeding, infection/sepsis, stent related symptoms including flank pain/urgency/frequency/incontinence/dysuria, ureteral injury, inability to access stone, or need for staged or additional procedures.  Left ureteroscopy, laser lithotripsy, stent change   Legrand Rams, MD 02/22/2023  [facility] [address], [address] [place], [place] [address] [phone]

## 2023-02-22 NOTE — Op Note (Signed)
Date of procedure: 02/22/23  Preoperative diagnosis:  Left ureteral stone  Postoperative diagnosis:  Same  Procedure: Cystoscopy, left ureteroscopy, basket extraction of ureteral stone, left retrograde pyelogram with intraoperative interpretation, left ureteral stent placement  Surgeon: Legrand Rams, MD  Anesthesia: General  Complications: None  Intraoperative findings:  Normal cystoscopy, uncomplicated basket extraction of 5 mm left mid ureteral stone Stent replaced with Dangler  EBL: Minimal  Specimens: Stone for analysis  Drains: Left 6 French by 24 cm ureteral stent  Indication: Kathleen Frazier is a 67 y.o. patient with 5 mm left mid ureteral stone who previously presented with sepsis from urinary source and underwent urgent stent placement, here today for definitive treatment.  After reviewing the management options for treatment, they elected to proceed with the above surgical procedure(s). We have discussed the potential benefits and risks of the procedure, side effects of the proposed treatment, the likelihood of the patient achieving the goals of the procedure, and any potential problems that might occur during the procedure or recuperation. Informed consent has been obtained.  Description of procedure:  The patient was taken to the operating room and general anesthesia was induced. SCDs were placed for DVT prophylaxis. The patient was placed in the dorsal lithotomy position, prepped and draped in the usual sterile fashion, and preoperative antibiotics(Keflex) were administered. A preoperative time-out was performed.   A 21 French rigid cystoscope was used to intubate the urethra and thorough cystoscopy was performed.  The bladder was grossly normal throughout.  A sensor wire was passed into the left ureteral orifice alongside the stent up to the kidney under fluoroscopic vision.  The old stent was removed.  A semirigid long ureteroscope advanced easily alongside the  wire and identified a 5 mm yellow and black stone in the mid ureter.  This was basketed and removed in its entirety and sent for analysis.  The scope was reinserted and advanced up to the proximal ureter and no other stones were seen.  I then advanced a single channel digital flexible ureteroscope over the wire and thorough pyeloscopy showed no other stones or fragments.  A retrograde pyelogram was performed from the proximal ureter which showed no extravasation or filling defects.  The wire was replaced and the scope removed.  A 6 French by 24 cm ureteral stent was placed fluoroscopically with a curl in the upper pole as well as in the bladder.  The bladder was drained.  The Dangler was secured to the suprapubic region with Mastisol and Tegaderm.  Disposition: Stable to PACU  Plan: Remove stent at home on Tuesday morning Will call with stone analysis results, can follow-up with urology as needed  Legrand Rams, MD

## 2023-02-23 ENCOUNTER — Encounter: Payer: Self-pay | Admitting: Urology

## 2023-03-01 LAB — CALCULI, WITH PHOTOGRAPH (CLINICAL LAB)
Calcium Oxalate Dihydrate: 30 %
Calcium Oxalate Monohydrate: 70 %
Weight Calculi: 36 mg

## 2023-12-18 ENCOUNTER — Encounter (INDEPENDENT_AMBULATORY_CARE_PROVIDER_SITE_OTHER): Payer: Self-pay | Admitting: *Deleted

## 2024-05-01 ENCOUNTER — Other Ambulatory Visit: Payer: Self-pay | Admitting: Internal Medicine

## 2024-05-01 DIAGNOSIS — Z1231 Encounter for screening mammogram for malignant neoplasm of breast: Secondary | ICD-10-CM

## 2024-06-05 ENCOUNTER — Ambulatory Visit
Admission: RE | Admit: 2024-06-05 | Discharge: 2024-06-05 | Disposition: A | Discharge status: Home / Self Care | Source: Ambulatory Visit | Attending: Internal Medicine | Admitting: Internal Medicine

## 2024-06-05 DIAGNOSIS — Z1231 Encounter for screening mammogram for malignant neoplasm of breast: Secondary | ICD-10-CM | POA: Insufficient documentation

## 2024-11-24 ENCOUNTER — Emergency Department

## 2024-11-24 ENCOUNTER — Observation Stay

## 2024-11-24 ENCOUNTER — Other Ambulatory Visit: Payer: Self-pay

## 2024-11-24 ENCOUNTER — Observation Stay: Admission: EM | Admit: 2024-11-24 | Discharge: 2024-11-25 | Disposition: A | Discharge status: Home / Self Care

## 2024-11-24 ENCOUNTER — Encounter: Payer: Self-pay | Admitting: Emergency Medicine

## 2024-11-24 DIAGNOSIS — S270XXA Traumatic pneumothorax, initial encounter: Principal | ICD-10-CM | POA: Insufficient documentation

## 2024-11-24 DIAGNOSIS — G250 Essential tremor: Secondary | ICD-10-CM | POA: Insufficient documentation

## 2024-11-24 DIAGNOSIS — W19XXXA Unspecified fall, initial encounter: Secondary | ICD-10-CM | POA: Insufficient documentation

## 2024-11-24 DIAGNOSIS — Z8673 Personal history of transient ischemic attack (TIA), and cerebral infarction without residual deficits: Secondary | ICD-10-CM | POA: Insufficient documentation

## 2024-11-24 DIAGNOSIS — Y929 Unspecified place or not applicable: Secondary | ICD-10-CM | POA: Insufficient documentation

## 2024-11-24 DIAGNOSIS — S2241XA Multiple fractures of ribs, right side, initial encounter for closed fracture: Principal | ICD-10-CM | POA: Insufficient documentation

## 2024-11-24 DIAGNOSIS — E782 Mixed hyperlipidemia: Secondary | ICD-10-CM | POA: Insufficient documentation

## 2024-11-24 DIAGNOSIS — J939 Pneumothorax, unspecified: Secondary | ICD-10-CM

## 2024-11-24 DIAGNOSIS — F411 Generalized anxiety disorder: Secondary | ICD-10-CM | POA: Diagnosis not present

## 2024-11-24 DIAGNOSIS — I10 Essential (primary) hypertension: Secondary | ICD-10-CM | POA: Insufficient documentation

## 2024-11-24 DIAGNOSIS — K219 Gastro-esophageal reflux disease without esophagitis: Secondary | ICD-10-CM | POA: Insufficient documentation

## 2024-11-24 DIAGNOSIS — E1165 Type 2 diabetes mellitus with hyperglycemia: Secondary | ICD-10-CM | POA: Diagnosis not present

## 2024-11-24 DIAGNOSIS — Z79899 Other long term (current) drug therapy: Secondary | ICD-10-CM | POA: Insufficient documentation

## 2024-11-24 DIAGNOSIS — Z7902 Long term (current) use of antithrombotics/antiplatelets: Secondary | ICD-10-CM | POA: Diagnosis not present

## 2024-11-24 DIAGNOSIS — Z7982 Long term (current) use of aspirin: Secondary | ICD-10-CM | POA: Insufficient documentation

## 2024-11-24 DIAGNOSIS — R079 Chest pain, unspecified: Secondary | ICD-10-CM | POA: Diagnosis present

## 2024-11-24 DIAGNOSIS — F329 Major depressive disorder, single episode, unspecified: Secondary | ICD-10-CM | POA: Insufficient documentation

## 2024-11-24 DIAGNOSIS — R0602 Shortness of breath: Secondary | ICD-10-CM | POA: Diagnosis not present

## 2024-11-24 LAB — RESP PANEL BY RT-PCR (RSV, FLU A&B, COVID)  RVPGX2
Influenza A by PCR: NEGATIVE
Influenza B by PCR: NEGATIVE
Resp Syncytial Virus by PCR: NEGATIVE
SARS Coronavirus 2 by RT PCR: NEGATIVE

## 2024-11-24 LAB — BASIC METABOLIC PANEL WITH GFR
Anion gap: 12 (ref 5–15)
BUN: 18 mg/dL (ref 8–23)
CO2: 25 mmol/L (ref 22–32)
Calcium: 10.6 mg/dL — ABNORMAL HIGH (ref 8.9–10.3)
Chloride: 101 mmol/L (ref 98–111)
Creatinine, Ser: 0.85 mg/dL (ref 0.44–1.00)
GFR, Estimated: >60 mL/min
Glucose, Bld: 171 mg/dL — ABNORMAL HIGH (ref 70–99)
Potassium: 4 mmol/L (ref 3.5–5.1)
Sodium: 137 mmol/L (ref 135–145)

## 2024-11-24 LAB — CBC WITH DIFFERENTIAL/PLATELET
Abs Immature Granulocytes: 0.02 K/uL (ref 0.00–0.07)
Basophils Absolute: 0 K/uL (ref 0.0–0.1)
Basophils Relative: 1 %
Eosinophils Absolute: 0.1 K/uL (ref 0.0–0.5)
Eosinophils Relative: 1 %
HCT: 38.8 % (ref 36.0–46.0)
Hemoglobin: 13.1 g/dL (ref 12.0–15.0)
Immature Granulocytes: 0 %
Lymphocytes Relative: 27 %
Lymphs Abs: 1.8 K/uL (ref 0.7–4.0)
MCH: 32.6 pg (ref 26.0–34.0)
MCHC: 33.8 g/dL (ref 30.0–36.0)
MCV: 96.5 fL (ref 80.0–100.0)
Monocytes Absolute: 0.6 K/uL (ref 0.1–1.0)
Monocytes Relative: 9 %
Neutro Abs: 4.1 K/uL (ref 1.7–7.7)
Neutrophils Relative %: 62 %
Platelets: 295 K/uL (ref 150–400)
RBC: 4.02 MIL/uL (ref 3.87–5.11)
RDW: 12.7 % (ref 11.5–15.5)
WBC: 6.6 K/uL (ref 4.0–10.5)
nRBC: 0 % (ref 0.0–0.2)

## 2024-11-24 LAB — CBG MONITORING, ED: Glucose-Capillary: 176 mg/dL — ABNORMAL HIGH (ref 70–99)

## 2024-11-24 MED ORDER — ATORVASTATIN CALCIUM 20 MG PO TABS
10.0000 mg | ORAL_TABLET | Freq: Every day | ORAL | Status: DC
  Administered 2024-11-25: 10 mg via ORAL
  Filled 2024-11-24: qty 1

## 2024-11-24 MED ORDER — OXYCODONE HCL 5 MG PO TABS
5.0000 mg | ORAL_TABLET | ORAL | Status: DC | PRN
  Administered 2024-11-25: 5 mg via ORAL
  Filled 2024-11-24: qty 1

## 2024-11-24 MED ORDER — ONDANSETRON HCL 4 MG/2ML IJ SOLN: 4.0000 mg | Freq: Four times a day (QID) | INTRAMUSCULAR | Status: DC | PRN

## 2024-11-24 MED ORDER — PANTOPRAZOLE SODIUM 40 MG PO TBEC
40.0000 mg | DELAYED_RELEASE_TABLET | ORAL | Status: DC
  Administered 2024-11-25: 40 mg via ORAL
  Filled 2024-11-24: qty 1

## 2024-11-24 MED ORDER — ACETAMINOPHEN 650 MG RE SUPP: 650.0000 mg | Freq: Four times a day (QID) | RECTAL | Status: DC | PRN

## 2024-11-24 MED ORDER — PAROXETINE HCL 20 MG PO TABS
20.0000 mg | ORAL_TABLET | Freq: Every day | ORAL | Status: DC
  Administered 2024-11-25: 20 mg via ORAL
  Filled 2024-11-24: qty 1

## 2024-11-24 MED ORDER — LACTATED RINGERS IV SOLN: INTRAVENOUS | Status: DC

## 2024-11-24 MED ORDER — IOHEXOL 300 MG/ML  SOLN
75.0000 mL | Freq: Once | INTRAMUSCULAR | Status: AC | PRN
  Administered 2024-11-24: 75 mL via INTRAVENOUS

## 2024-11-24 MED ORDER — SODIUM CHLORIDE 0.9% FLUSH
3.0000 mL | Freq: Two times a day (BID) | INTRAVENOUS | Status: DC
  Administered 2024-11-24: 3 mL via INTRAVENOUS

## 2024-11-24 MED ORDER — INSULIN ASPART 100 UNIT/ML IJ SOLN
0.0000 [IU] | Freq: Three times a day (TID) | INTRAMUSCULAR | Status: DC
  Administered 2024-11-25: 5 [IU] via SUBCUTANEOUS
  Filled 2024-11-24: qty 5

## 2024-11-24 MED ORDER — INSULIN ASPART 100 UNIT/ML IJ SOLN: 0.0000 [IU] | Freq: Every day | INTRAMUSCULAR | Status: DC

## 2024-11-24 MED ORDER — OXYCODONE HCL 5 MG PO TABS
5.0000 mg | ORAL_TABLET | Freq: Once | ORAL | Status: AC
  Administered 2024-11-24: 5 mg via ORAL
  Filled 2024-11-24: qty 1

## 2024-11-24 MED ORDER — PROPRANOLOL HCL ER 80 MG PO CP24
160.0000 mg | ORAL_CAPSULE | Freq: Every day | ORAL | Status: DC
  Administered 2024-11-25: 160 mg via ORAL
  Filled 2024-11-24: qty 2

## 2024-11-24 MED ORDER — ACETAMINOPHEN 325 MG PO TABS: 650.0000 mg | ORAL_TABLET | Freq: Four times a day (QID) | ORAL | Status: DC | PRN

## 2024-11-24 MED ORDER — TRAZODONE HCL 50 MG PO TABS
50.0000 mg | ORAL_TABLET | Freq: Every day | ORAL | Status: DC
  Administered 2024-11-24: 50 mg via ORAL
  Filled 2024-11-24: qty 1

## 2024-11-24 MED ORDER — LORATADINE 10 MG PO TABS
10.0000 mg | ORAL_TABLET | ORAL | Status: DC
  Administered 2024-11-25: 10 mg via ORAL
  Filled 2024-11-24: qty 1

## 2024-11-24 MED ORDER — SENNOSIDES-DOCUSATE SODIUM 8.6-50 MG PO TABS: 1.0000 | ORAL_TABLET | Freq: Every evening | ORAL | Status: DC | PRN

## 2024-11-24 MED ORDER — LIDOCAINE 5 % EX PTCH
1.0000 | MEDICATED_PATCH | Freq: Once | CUTANEOUS | Status: AC
  Administered 2024-11-24: 1 via TRANSDERMAL
  Filled 2024-11-24: qty 1

## 2024-11-24 MED ORDER — ONDANSETRON HCL 4 MG PO TABS: 4.0000 mg | ORAL_TABLET | Freq: Four times a day (QID) | ORAL | Status: DC | PRN

## 2024-11-24 NOTE — ED Triage Notes (Signed)
 Pt reports right sided rib pain. Pt reports she noticed the pain after reaching up for her blinds yesterday. Pt had chest x-ray which showed right 6-8th rib fx. Denies falling or trauma.

## 2024-11-24 NOTE — ED Notes (Signed)
 Patient transported to CT

## 2024-11-24 NOTE — ED Provider Notes (Signed)
 "   [facility] Emergency Department Provider Note     Event Date/Time   First MD Initiated Contact with Patient 11/24/24 1538     (approximate)   History   Rib Pain   HPI  Kathleen Frazier is a 69 y.o. female with a history of DM type II, HTN, GERD, HLD, CVA, status post cardiac valve replacement, who presents to the ED for evaluation of acute right-sided pain status post mechanical injury.  Patient reports she was reaching for her blinds yesterday, when she noted onset of pain.  She presented initially to her PCP for x-ray which showed likely nondisplaced rib fractures from ribs 5-8.  She denies any frank chest pain or shortness of breath.   Physical Exam   Triage Vital Signs: ED Triage Vitals  Encounter Vitals Group     BP 11/24/24 1514 (!) 149/102     Girls Systolic BP Percentile --      Girls Diastolic BP Percentile --      Boys Systolic BP Percentile --      Boys Diastolic BP Percentile --      Pulse Rate 11/24/24 1514 89     Resp 11/24/24 1514 19     Temp 11/24/24 1514 98.1 F (36.7 C)     Temp Source 11/24/24 1514 Oral     SpO2 11/24/24 1514 93 %     Weight --      Height --      Head Circumference --      Peak Flow --      Pain Score 11/24/24 1515 4     Pain Loc --      Pain Education --      Exclude from Growth Chart --     Most recent vital signs: Vitals:   11/24/24 1514  BP: (!) 149/102  Pulse: 89  Resp: 19  Temp: 98.1 F (36.7 C)  SpO2: 93%    General Awake, no distress.  NAD HEENT NCAT. PERRL. EOMI. No rhinorrhea. Mucous membranes are moist. CV:  Good peripheral perfusion. RRR RESP:  Normal effort. CTA. Tender to the right mid-axillary chest wall. ABD:  No distention.  MSK:  AROM of all extremities. WNL gait   ED Results / Procedures / Treatments   Labs (all labs ordered are listed, but only abnormal results are displayed) Labs Reviewed  BASIC METABOLIC PANEL WITH GFR - Abnormal; Notable for the following  components:      Result Value   Glucose, Bld 171 (*)    Calcium  10.6 (*)    All other components within normal limits  RESP PANEL BY RT-PCR (RSV, FLU A&B, COVID)  RVPGX2  CBC WITH DIFFERENTIAL/PLATELET  HIV ANTIBODY (ROUTINE TESTING W REFLEX)  MAGNESIUM  BASIC METABOLIC PANEL WITH GFR  CBC  HEMOGLOBIN A1C    EKG   RADIOLOGY  I personally viewed and evaluated these images as part of my medical decision making, as well as reviewing the written report by the radiologist.  ED Provider Interpretation: Right 4th through 6th ribs with evidence of a small right apical pneumothorax.  DG Ribs Unilateral W/Chest Right Result Date: 11/24/2024 CLINICAL DATA:  Right rib pain. EXAM: RIGHT RIBS AND CHEST - 3+ VIEW COMPARISON:  Chest radiograph dated 03/12/2005. FINDINGS: Right 4th-6th rib fractures. There is a small right apical pneumothorax. No focal consolidation or pleural effusion. The cardiac silhouette is within normal limits. Median sternotomy wires and cardiac valve repair. No acute osseous pathology. IMPRESSION:  Right 4th-6th rib fractures with small right apical pneumothorax. These results were called by telephone at the time of interpretation on 11/24/2024 at 4:04 pm to [facility] PA, who verbally acknowledged these results. Electronically Signed   By: Vanetta Chou M.D.   On: 11/24/2024 16:17    PROCEDURES:  Critical Care performed: Yes, see critical care procedure note(s)  Procedures   MEDICATIONS ORDERED IN ED: Medications  lidocaine  (LIDODERM ) 5 % 1 patch (1 patch Transdermal Patch Applied 11/24/24 1653)  sodium chloride  flush (NS) 0.9 % injection 3 mL (has no administration in time range)  acetaminophen  (TYLENOL ) tablet 650 mg (has no administration in time range)    Or  acetaminophen  (TYLENOL ) suppository 650 mg (has no administration in time range)  senna-docusate (Senokot-S) tablet 1 tablet (has no administration in time range)  lactated ringers  infusion (has no  administration in time range)  ondansetron  (ZOFRAN ) tablet 4 mg (has no administration in time range)    Or  ondansetron  (ZOFRAN ) injection 4 mg (has no administration in time range)  oxyCODONE  (Oxy IR/ROXICODONE ) immediate release tablet 5 mg (has no administration in time range)  insulin  aspart (novoLOG ) injection 0-15 Units (has no administration in time range)  insulin  aspart (novoLOG ) injection 0-5 Units (has no administration in time range)  oxyCODONE  (Oxy IR/ROXICODONE ) immediate release tablet 5 mg (5 mg Oral Given 11/24/24 1654)  iohexol  (OMNIPAQUE ) 300 MG/ML solution 75 mL (75 mLs Intravenous Contrast Given 11/24/24 1828)     IMPRESSION / MDM / ASSESSMENT AND PLAN / ED COURSE  I reviewed the triage vital signs and the nursing notes.                              Differential diagnosis includes, but is not limited to, viral syndrome, bronchitis including COPD exacerbation, pneumonia, reactive airway disease including asthma, CHF including exacerbation with or without pulmonary/interstitial edema, pneumothorax, ACS, thoracic trauma, and pulmonary embolism.  Patient's presentation is most consistent with acute presentation with potential threat to life or bodily function.  Patient's diagnosis is consistent with mechanical injury resulting in acute, nondisplaced 4 through 6 right lateral ribs.  X-ray images, reported emergently by the radiologist, also shows a small right apical pneumothorax less than 20% volume.  Patient is stable in the ED.  She is in no acute respite distress.  Her O2 sats on room air about 93%.  She is endorsing acute right-sided rib pain.  No other injury reported at this time.  I discussed management options with her adult son who is at bedside, and the patient along with family agreeable to the plan of admission.  Patient will be admitted to the hospital service with surgical consult as appropriate.  Patient is placed on a nonrebreather, and acute pain meds are  provided at this time.   Clinical Course as of 11/24/24 1848  Tue Nov 24, 2024  1613 Stat report from [facility] radiology: patient with a less than 20% right pneumothorax and rib fractures on the right likely ribs 4 through 6. [JM]  1803 S/W Dr. KANDICE Bathe [facility]): he is agreeable to the admission, but would like [facility] contacted for possible evaluation/intervention.  [JM]  1811 Dr. Jordis (Sx) will consult as requested. CT chest w/CM added as requested.  [JM]    Clinical Course User Index [JM] Carisa Backhaus, Candida LULLA Kings, PA-C    FINAL CLINICAL IMPRESSION(S) / ED DIAGNOSES   Final diagnoses:  Closed fracture of  multiple ribs of right side, initial encounter  Pneumothorax on right     Rx / DC Orders   ED Discharge Orders     None        Note:  This document was prepared using Dragon voice recognition software and may include unintentional dictation errors.    Loyd Candida LULLA Aldona, PA-C 11/24/24 1850    Waymond Lorelle Cummins, MD 11/24/24 1909  "

## 2024-11-24 NOTE — Consult Note (Signed)
 Patient ID: Kathleen Frazier, female   DOB: [birth year], 69 y.o.   MRN: [id number]  HPI [name] is a 69 y.o. female seen in consultation at the request of Ms. Loyd PA-C for right apical pneumothorax and 3 rib fractures, d/w her in detail.  Patient does not remember whether she fell or not.  She does have some degree of dementia.  She is legally blind and although she lives alone she requires significant assistance for ADLs.  She reports that she was leaning towards her windows think she felt a pull towards the right chest wall.  She experiences now intermittent pain that is moderate to severe sharp located in the right chest wall worsening with deep inspiration.  No fevers no chills.  She is able to give a history and the son is at the bedside.  She does acknowledge significant memory loss and this is confirmed by her son.  She does have a history also of prior open heart surgery and mitral valve repair 20 years ago this was done at [facility].  She did have appropriate workup including chest x-ray and CT scan of the chest that I have personally reviewed showing 5th through 8th rib fractures with small apical pneumothorax.  No evidence of vascular injuries.  Tiny right pleural effusion. 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   Thrombocytopenia    Tremor, essential    Visual impairment    Right eye infarct    Past Surgical History:  Procedure Laterality Date   ARTERY BIOPSY Left 12/27/2022   Procedure: BIOPSY  TEMPORAL ARTERY;  Surgeon: Marea Selinda RAMAN, MD;  Location: [facility] ORS;  Service: Vascular;  Laterality: Left;   BREAST CYST ASPIRATION  30+ years ago   CARDIAC VALVE REPLACEMENT  2006   mitral valve replacement   COLONOSCOPY N/A 12/28/2013   Procedure: COLONOSCOPY;  Surgeon: Margo LITTIE Haddock, MD;  [facility];  Service: Endoscopy;  Laterality: N/A;  10:45 AM   CYSTOSCOPY WITH RETROGRADE PYELOGRAM, URETEROSCOPY AND STENT PLACEMENT Left 02/07/2023   Procedure: LEFT CYSTOSCOPY WITH RETROGRADE PYELOGRAM,  AND STENT PLACEMENT;  Surgeon: Francisca Redell BROCKS, MD;  Location: [facility] ORS;  Service: Urology;  Laterality: Left;   CYSTOSCOPY/URETEROSCOPY/HOLMIUM LASER/STENT PLACEMENT Left 02/22/2023   Procedure: CYSTOSCOPY/URETEROSCOPY/HOLMIUM LASER/STENT EXCHANGE;  Surgeon: Francisca Redell BROCKS, MD;  Location: [facility] ORS;  Service: Urology;  Laterality: Left;   FRACTURE SURGERY     tib - fib right leg   right leg has rod and screws     TUBAL LIGATION      Family History  Problem Relation Age of Onset   Stroke Mother 35       brain stem stroke   Breast cancer Neg Hx     Social History Social History[1]  Allergies[2]  Current Facility-Administered Medications  Medication Dose Route Frequency Provider Last Rate Last Admin   lidocaine  (LIDODERM ) 5 % 1 patch  1 patch Transdermal Once Menshew, Candida LULLA Kings, PA-C   1 patch at 11/24/24 1653   Current Outpatient Medications  Medication Sig Dispense Refill   acetaminophen  (TYLENOL ) 500 MG tablet Take 1,000 mg by mouth every 6 (six) hours as needed.     aspirin  81 MG tablet Take 81 mg by mouth daily.     atorvastatin  (LIPITOR ) 40 MG tablet Take 1 tablet (40 mg total) by mouth daily. (Patient taking differently: Take 40 mg by mouth at bedtime.) 30 tablet 1   calcium  carbonate (TUMS - DOSED IN MG ELEMENTAL CALCIUM ) 500 MG chewable tablet Chew 1 tablet by mouth as needed for indigestion or heartburn.     cephALEXin  (KEFLEX ) 500 MG capsule Take 1 capsule (500  mg total) by mouth daily. 5 capsule 0   loratadine  (CLARITIN ) 10 MG tablet Take 10 mg by mouth every morning.     metFORMIN (GLUCOPHAGE) 500 MG tablet Take 500 mg by mouth daily with supper.     Multiple Vitamin (MULTIVITAMIN) tablet Take 1 tablet by mouth daily.     NON FORMULARY Calcium  600 + D    BID     oxycodone  (OXY-IR) 5 MG capsule Take 5 mg by mouth every 4 (four) hours as needed.     OZEMPIC, 0.25 OR 0.5 MG/DOSE, 2 MG/3ML SOPN Inject 0.25 mg as directed once a week. Saturdays     pantoprazole  (PROTONIX ) 40 MG tablet Take 40 mg by mouth every morning.     sertraline  (ZOLOFT ) 100 MG tablet Take 100 mg by mouth every evening. May take 100mg  or 1.5 tablet qhs       Review of Systems Full ROS  was asked and was negative except for the information on the HPI  Physical Exam Blood pressure (!) 149/102, pulse 89, temperature 98.1 F (36.7 C), temperature source Oral, resp. rate 19, height 5' 2 (1.575 m), weight 54 kg, SpO2 93%. CONSTITUTIONAL: NAD No scalp hematomas or evidence of  cranial trauma C spine w/o tenderness and normal ROM. EYES: Pupils are equal, round, Sclera are non-icteric. EARS, NOSE, MOUTH AND THROAT: The oropharynx is clear. The oral mucosa is pink and moist. Hearing is intact to voice. LYMPH NODES:  Lymph nodes in the neck are normal. RESPIRATORY:  Lungs are clear. There is normal respiratory effort, with equal breath sounds bilaterally, here is R sided chest wall tenderness 5-8 ribs,  no subcutaneous air CARDIOVASCULAR: Heart is regular without murmurs, gallops, or rubs. GI: The abdomen is  soft, nontender, and nondistended. There are no palpable masses. There is no hepatosplenomegaly. There are normal bowel sounds in all quadrants. GU: Rectal deferred.   MUSCULOSKELETAL: Normal muscle strength and tone. No cyanosis or edema.   SKIN: Turgor is good and there are no pathologic skin lesions or ulcers. NEUROLOGIC: Motor and sensation is grossly normal. Cranial nerves  are grossly intact. PSYCH:  Oriented to person, place and time. Affect is normal.  Data Reviewed I have personally reviewed the patient's imaging, laboratory findings and medical records.    Assessment/Plan Right multiple rib fractures and tiny apical pneumothorax.  No need for chest tube placement at this time.  Recommend conservative treatment and supportive care including pain medication, pulmonary toilet, incentive spirometer and we will follow her without chest x-ray in the morning.  No need for surgical intervention. D/w both pt and son in detail I personally spent a total of 75 minutes in the care of the patient today including performing a medically appropriate exam/evaluation,  counseling and educating, placing orders, referring and communicating with other health care professionals, documenting clinical information in the EHR, independently interpreting and reviewing images studies and coordinating care.    Laneta Luna, MD FACS General Surgeon 11/24/2024, 6:09 PM      [1]  Social History Tobacco Use   Smoking status: Never   Smokeless tobacco: Never  Substance Use Topics   Alcohol use: Yes    Comment: occasional   Drug use: Never  [2] No Known Allergies

## 2024-11-24 NOTE — ED Notes (Signed)
 Pt weaned from NRB by attending doc, states RA ok as long as sats remain >94%.

## 2024-11-24 NOTE — H&P (Signed)
 " History and Physical    Kathleen Frazier FMW:[id number] DOB: May 28, 1956 DOA: 11/24/2024  DOS: the patient was seen and examined on 11/24/2024  PCP: Cleotilde Oneil FALCON, MD   Patient coming from: Home  I have personally briefly reviewed patient's old medical records in [facility] and CareEverywhere  HPI:   Kathleen Frazier is a 69 y.o. year old female with medical history of hypertension, hyperlipidemia, type 2 diabetes, MDD, GAD presenting to the ED with chest pain. States this occurred after she woke up from sleep. She is not sure of what caused the pain. Denies any trauma. 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  When asking she states this occurred after she was waiting to open the blinds on her report.  But on multiple questioning she denies any trauma.  On arrival to the ED patient was noted to be HDS stable.  Lab work and imaging obtained.  CBC without leukocytosis and fully unremarkable, BMP with moderate hypercalcemia and moderate hyperglycemia.  Respiratory panel negative for COVID, flu, and RSV.  Chest x-ray shows right 4th through 6th rib fractures with small apical pneumothorax.  Given this, TR[facility] consulted for admission.  Requested for management given apical pneumothorax.   Review of Systems: As mentioned in the history of present illness. All other systems reviewed and are negative.   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   Thrombocytopenia    Tremor, essential    Visual impairment    Right eye infarct    Past Surgical History:  Procedure Laterality Date   ARTERY BIOPSY Left 12/27/2022    Procedure: BIOPSY TEMPORAL ARTERY;  Surgeon: Marea Selinda RAMAN, MD;  Location: [facility] OR[facility];  Service: Vascular;  Laterality: Left;   BREAST CYST ASPIRATION  30+ years ago   CARDIAC VALVE REPLACEMENT  2006   mitral valve replacement   COLONOSCOPY N/A 12/28/2013   Procedure: COLONOSCOPY;  Surgeon: Margo LITTIE Haddock, MD;  [facility];  Service: Endoscopy;  Laterality: N/A;  10:45 AM   CYSTOSCOPY WITH RETROGRADE PYELOGRAM, URETEROSCOPY AND STENT PLACEMENT Left 02/07/2023   Procedure: LEFT CYSTOSCOPY WITH RETROGRADE PYELOGRAM,  AND STENT PLACEMENT;  Surgeon: Francisca Redell BROCKS, MD;  Location: [facility];  Service: Urology;  Laterality: Left;   CYSTOSCOPY/URETEROSCOPY/HOLMIUM LASER/STENT PLACEMENT Left 02/22/2023   Procedure: CYSTOSCOPY/URETEROSCOPY/HOLMIUM LASER/STENT EXCHANGE;  Surgeon: Francisca Redell BROCKS, MD;  Location: [facility];  Service: Urology;  Laterality: Left;   FRACTURE SURGERY     tib - fib right leg   right leg has rod and screws     TUBAL LIGATION       Allergies[1]  Family History  Problem Relation Age of Onset   Stroke Mother 6       brain stem stroke   Breast cancer Neg Hx     Prior to Admission medications  Medication Sig Start Date End Date Taking? Authorizing Provider  acetaminophen  (TYLENOL ) 500 MG tablet Take 1,000 mg by mouth every 6 (six) hours as needed.  Yes [provider]  aspirin  81 MG tablet Take 81 mg by mouth daily.   Yes [provider]  atorvastatin  (LIPITOR ) 10 MG tablet Take 10 mg by mouth daily.   Yes [provider]  calcium  carbonate (TUMS - DOSED IN MG ELEMENTAL CALCIUM ) 500 MG chewable tablet Chew 1 tablet by mouth as needed for indigestion or heartburn.   Yes [provider]  ciprofloxacin (CIPRO) 250 MG tablet Take 250 mg by mouth 2 (two) times daily. 09/28/24  Yes [provider]  clopidogrel  (PLAVIX ) 75 MG tablet Take 75 mg by mouth daily.   Yes [provider]  loratadine  (CLARITIN ) 10 MG  tablet Take 10 mg by mouth every morning.   Yes [provider]  metFORMIN (GLUCOPHAGE) 500 MG tablet Take 500 mg by mouth daily with supper. 02/28/22  Yes [provider]  Multiple Vitamin (MULTIVITAMIN) tablet Take 1 tablet by mouth daily.   Yes [provider]  NON FORMULARY Calcium  600 + D    BID   Yes [provider]  ondansetron  (ZOFRAN -ODT) 4 MG disintegrating tablet Take 4 mg by mouth every 8 (eight) hours as needed. 09/29/24  Yes [provider]  pantoprazole  (PROTONIX ) 40 MG tablet Take 40 mg by mouth every morning.   Yes [provider]  PARoxetine  (PAXIL ) 20 MG tablet Take 20 mg by mouth daily.   Yes [provider]  propranolol  ER (INDERAL  LA) 160 MG SR capsule Take 160 mg by mouth daily.   Yes [provider]  traZODone  (DESYREL ) 50 MG tablet Take 50 mg by mouth at bedtime.   Yes [provider]  venlafaxine (EFFEXOR) 25 MG tablet Take 25 mg by mouth at bedtime. 09/01/24  Yes [provider]  atorvastatin  (LIPITOR ) 40 MG tablet Take 1 tablet (40 mg total) by mouth daily. Patient not taking: Reported on 11/24/2024 03/08/22   Leotis Bogus, MD  cephALEXin  (KEFLEX ) 500 MG capsule Take 1 capsule (500 mg total) by mouth daily. Patient not taking: Reported on 11/24/2024 02/22/23   Francisca Redell BROCKS, MD  oxycodone  (OXY-IR) 5 MG capsule Take 5 mg by mouth every 4 (four) hours as needed. Patient not taking: Reported on 11/24/2024    [provider]  OZEMPIC, 0.25 OR 0.5 MG/DOSE, 2 MG/3ML SOPN Inject 0.25 mg as directed once a week. Saturdays 12/06/22   [provider]  sertraline  (ZOLOFT ) 100 MG tablet Take 100 mg by mouth every evening. May take 100mg  or 1.5 tablet qhs Patient not taking: Reported on 11/24/2024    [provider]    Social History:  reports that she has never smoked. She has never used smokeless tobacco. She reports current alcohol use. She reports that she does  not use drugs. Does not smoke or drink.  Walks without any assistive device.   Physical Exam: Vitals:   11/24/24 1514 11/24/24 1727  BP: (!) 149/102   Pulse: 89   Resp: 19   Temp: 98.1 F (36.7 C)   TempSrc: Oral   SpO2: 93%   Weight:  54 kg  Height:  5' 2 (1.575 m)    Gen: NAD HENT: NCAT CV: normal heart sounds Lung: CTAB Abd: No TTP, normal bowel sounds MSK: TTP of right chest, no asymmetry, good bulk and tone Neuro: alert and oriented   Labs on Admission: I have personally reviewed following labs and imaging studies  CBC: Recent Labs  Lab 11/24/24 1656  WBC 6.6  NEUTROABS 4.1  HGB 13.1  HCT 38.8  MCV 96.5  PLT 295   Basic Metabolic Panel: Recent Labs  Lab 11/24/24 1656  NA 137  K 4.0  CL 101  CO2 25  GLUCOSE 171*  BUN 18  CREATININE 0.85  CALCIUM  10.6*   GFR: Estimated Creatinine Clearance: 49.4 mL/min (by C-G formula based on SCr of 0.85 mg/dL). Liver Function Tests: No results for input(s): AST, ALT, ALKPHOS, BILITOT, PROT, ALBUMIN in the last 168 hours. No results for input(s): LIPASE, AMYLASE in the last 168 hours. No results for input(s): AMMONIA in the last 168 hours. Coagulation Profile: No results for input(s): INR, PROTIME in the last 168 hours. Cardiac Enzymes: No results for input(s): CKTOTAL, CKMB, CKMBINDEX, TROPONINI, TROPONINIHS in the last 168 hours. BNP (last 3 results) No results for input(s): BNP in the last 8760 hours. HbA1C: No results for input(s): HGBA1C in the last 72 hours. CBG: No results for input(s): GLUCAP in the last 168 hours. Lipid Profile: No results for input(s): CHOL, HDL, LDLCALC, TRIG, CHOLHDL, LDLDIRECT in the last 72 hours. Thyroid  Function Tests: No results for input(s): TSH, T4TOTAL, FREET4, T3FREE, THYROIDAB in the last 72 hours. Anemia Panel: No results for input(s): VITAMINB12, FOLATE, FERRITIN, TIBC, IRON, RETICCTPCT in the  last 72 hours. Urine analysis:    Component Value Date/Time   COLORURINE AMBER (A) 02/07/2023 1648   APPEARANCEUR Cloudy (A) 02/15/2023 1241   LABSPEC 1.021 02/07/2023 1648   PHURINE 5.0 02/07/2023 1648   GLUCOSEU 2+ (A) 02/15/2023 1241   HGBUR LARGE (A) 02/07/2023 1648   BILIRUBINUR Negative 02/15/2023 1241   KETONESUR NEGATIVE 02/07/2023 1648   PROTEINUR 3+ (A) 02/15/2023 1241   PROTEINUR 100 (A) 02/07/2023 1648   NITRITE Negative 02/15/2023 1241   NITRITE POSITIVE (A) 02/07/2023 1648   LEUKOCYTESUR Trace (A) 02/15/2023 1241   LEUKOCYTESUR SMALL (A) 02/07/2023 1648    Radiological Exams on Admission: I have personally reviewed images CT Chest W Contrast Result Date: 11/24/2024 EXAM: CT CHEST WITH CONTRAST 11/24/2024 06:46:13 PM TECHNIQUE: CT of the chest was performed with the administration of 75 mL of iohexol  (OMNIPAQUE ) 300 MG/ML solution. Multiplanar reformatted images are provided for review. Automated exposure control, iterative reconstruction, and/or weight based adjustment of the mA/kV was utilized to reduce the radiation dose to as low as reasonably achievable. COMPARISON: None available. CLINICAL HISTORY: Chest trauma, blunt. Right apical pneumothorax involving less than 20% of the lung. FINDINGS: MEDIASTINUM: Status post mitral valve replacement. Mild cardiomegaly. Mild atherosclerotic calcification within the thoracic aorta. The central airways are clear. LYMPH NODES: No mediastinal, hilar or axillary lymphadenopathy. LUNGS AND PLEURA: Small right apical pneumothorax. No CT evidence of tension physiology. Bibasilar atelectasis. No superimposed pleural pulmonary infiltrate. No central obstructing lesion. Trace right pleural effusion. No pulmonary edema. SOFT TISSUES/BONES: Mildly displaced acute fractures of the right 5 - 8 ribs are noted laterally. UPPER ABDOMEN: Cholelithiasis. Limited images of the upper abdomen demonstrates no acute abnormality. IMPRESSION: 1. Mildly displaced  acute fractures of the right 5-8 ribs laterally. 2. Small right apical pneumothorax without CT evidence of tension physiology. Trace right pleural effusion. 3. Status post mitral valve replacement. Mild cardiomegaly. 4. raf score - Aortic atherosclerosis ([id number]-i70.0). Electronically signed by: Dorethia Molt MD 11/24/2024 07:02 PM EST RP Workstation: [id number]   DG Ribs Unilateral W/Chest Right Result Date: 11/24/2024 CLINICAL DATA:  Right rib pain. EXAM: RIGHT RIBS AND CHEST - 3+ VIEW COMPARISON:  Chest radiograph dated 03/12/2005. FINDINGS: Right 4th-6th rib fractures. There is a small  right apical pneumothorax. No focal consolidation or pleural effusion. The cardiac silhouette is within normal limits. Median sternotomy wires and cardiac valve repair. No acute osseous pathology. IMPRESSION: Right 4th-6th rib fractures with small right apical pneumothorax. These results were called by telephone at the time of interpretation on 11/24/2024 at 4:04 pm to [facility] PA, who verbally acknowledged these results. Electronically Signed   By: Vanetta Chou M.D.   On: 11/24/2024 16:17    EKG: My personal interpretation of EKG shows: Pending    Assessment/Plan Principal Problem:   Pneumothorax on right Active Problems:   Mixed hyperlipidemia   Uncontrolled type 2 diabetes mellitus with hyperglycemia, without long-term current use of insulin  (HCC)   History of CVA (cerebrovascular accident)   Essential tremor   Major depressive disorder, single episode, unspecified   Gastroesophageal reflux disease   Patient presented with chest pain and was found to have a pneumothorax along with right 5th-8th rib fracture.  CT showed small right apical pneumothorax without any change in physiology.  Patient currently breathing on room air at 100%.  General Surgery was consulted and will see patient in consultation.  Appreciate their recommendations. Will give oxycodone  5 mg as needed for pain.    Chronic  Problems: Restart home meds once med reconciliation is completed.  HTN:.  Patient not on any therapy.  Will continue to monitor HLD: continue home meds T2DM: hold home meds, start on SSI and titrate GERD: continue home PPI MDD/GAD: continue home meds Essential tremor: Continue home beta-blocker   VTE prophylaxis:  SCDs  Diet: N.p.o. Code Status:  Full Code Telemetry:  Admission status: Observation, Progressive Patient is from: Home Anticipated d/c is to: Home Anticipated d/c is in: 1-2 days   Family Communication: Updated at bedside  Consults called: General Surgery   Severity of Illness: The appropriate patient status for this patient is OBSERVATION. Observation status is judged to be reasonable and necessary in order to provide the required intensity of service to ensure the patient's safety. The patient's presenting symptoms, physical exam findings, and initial radiographic and laboratory data in the context of their medical condition is felt to place them at decreased risk for further clinical deterioration. Furthermore, it is anticipated that the patient will be medically stable for discharge from the hospital within 2 midnights of admission.    Morene Bathe, MD [facility] DEL. [facility]     [1] No Known Allergies  "

## 2024-11-24 NOTE — Discharge Instructions (Addendum)
 Take medications as prescribed Ambulate as much as tolerated Follow up with PCP within one week

## 2024-11-25 ENCOUNTER — Observation Stay

## 2024-11-25 DIAGNOSIS — J939 Pneumothorax, unspecified: Secondary | ICD-10-CM | POA: Diagnosis not present

## 2024-11-25 DIAGNOSIS — S2241XA Multiple fractures of ribs, right side, initial encounter for closed fracture: Secondary | ICD-10-CM | POA: Diagnosis not present

## 2024-11-25 LAB — BASIC METABOLIC PANEL WITH GFR
Anion gap: 16 — ABNORMAL HIGH (ref 5–15)
BUN: 13 mg/dL (ref 8–23)
CO2: 21 mmol/L — ABNORMAL LOW (ref 22–32)
Calcium: 9.7 mg/dL (ref 8.9–10.3)
Chloride: 99 mmol/L (ref 98–111)
Creatinine, Ser: 0.82 mg/dL (ref 0.44–1.00)
GFR, Estimated: >60 mL/min
Glucose, Bld: 181 mg/dL — ABNORMAL HIGH (ref 70–99)
Potassium: 4.6 mmol/L (ref 3.5–5.1)
Sodium: 135 mmol/L (ref 135–145)

## 2024-11-25 LAB — CBC
HCT: 36.7 % (ref 36.0–46.0)
Hemoglobin: 12.4 g/dL (ref 12.0–15.0)
MCH: 32.1 pg (ref 26.0–34.0)
MCHC: 33.8 g/dL (ref 30.0–36.0)
MCV: 95.1 fL (ref 80.0–100.0)
Platelets: 300 K/uL (ref 150–400)
RBC: 3.86 MIL/uL — ABNORMAL LOW (ref 3.87–5.11)
RDW: 12.7 % (ref 11.5–15.5)
WBC: 5.7 K/uL (ref 4.0–10.5)
nRBC: 0 % (ref 0.0–0.2)

## 2024-11-25 LAB — HEMOGLOBIN A1C
Hgb A1c MFr Bld: 8.4 % — ABNORMAL HIGH (ref 4.8–5.6)
Mean Plasma Glucose: 194.38 mg/dL

## 2024-11-25 LAB — HIV ANTIBODY (ROUTINE TESTING W REFLEX): HIV Screen 4th Generation wRfx: NONREACTIVE

## 2024-11-25 LAB — CBG MONITORING, ED: Glucose-Capillary: 243 mg/dL — ABNORMAL HIGH (ref 70–99)

## 2024-11-25 LAB — MAGNESIUM: Magnesium: 1.6 mg/dL — ABNORMAL LOW (ref 1.7–2.4)

## 2024-11-25 MED ORDER — OXYCODONE HCL 5 MG PO CAPS: 5.0000 mg | ORAL_CAPSULE | ORAL | 0 refills | Status: AC | PRN

## 2024-11-25 MED ORDER — OXYCODONE HCL 5 MG PO TABS: 5.0000 mg | ORAL_TABLET | ORAL | Status: DC | PRN

## 2024-11-25 MED ORDER — DICLOFENAC SODIUM 1 % EX GEL: 2.0000 g | Freq: Four times a day (QID) | CUTANEOUS | 0 refills | Status: AC

## 2024-11-25 MED ORDER — DICLOFENAC SODIUM 1 % EX GEL
2.0000 g | Freq: Four times a day (QID) | CUTANEOUS | Status: DC
  Filled 2024-11-25: qty 100

## 2024-11-25 MED ORDER — ACETAMINOPHEN 325 MG PO TABS
975.0000 mg | ORAL_TABLET | Freq: Three times a day (TID) | ORAL | Status: DC
  Administered 2024-11-25: 975 mg via ORAL
  Filled 2024-11-25: qty 3

## 2024-11-25 NOTE — Discharge Summary (Signed)
 " [facility] Phys[facility] Discharge Summary   Patient name: Kathleen Frazier  Admit date:     11/24/2024  Discharge date: 11/25/2024  Attending Physician: FERNAND PROST [[id number]]  Discharge Physician: Norval Bar   PCP: Cleotilde Oneil FALCON, MD  Admitted From: Home  Disposition:  Home  Recommendations for Outpatient Follow-up:  Follow up with PCP in 1-2 weeks  Home Health:No Equipment/Devices: @ECDMELIST @  Discharge Condition:Stable CODE STATUS:FULL Diet recommendation: Heart Healthy/Diabetic Fluid Restriction: None  Hospital Summary:  69 y.o. year old female with medical history of hypertension, hyperlipidemia, type 2 diabetes, MDD, GAD presenting to the ED with chest pain. States this occurred after she woke up from sleep. She is not sure of what caused the pain. Denies any trauma. 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  When asking she states this occurred after she was waiting to open the blinds on her report.  But on multiple questioning she denies any trauma.  On arrival to the ED patient was noted to be HDS stable.  Lab work and imaging obtained.  CBC without leukocytosis and fully unremarkable, BMP with moderate hypercalcemia and moderate hyperglycemia.  Respiratory panel negative for COVID, flu, and RSV.  Chest x-ray shows right 4th through 6th rib fractures with small apical pneumothorax that was stable on repeat imaging.  Seen by general surgery with no plan for chest tube placement and conservative care.   - take tylenol  1000mg  TID as needed - take oxycocone PRN for acute pain - encourage ambulation and deep breathing exercises - follow up with PCP within one week   Discharge Diagnoses:  Principal Problem:   Pneumothorax on right Active Problems:   Mixed hyperlipidemia   Uncontrolled type 2 diabetes mellitus with hyperglycemia, without long-term current use of insulin  (HCC)   History of CVA (cerebrovascular accident)   Essential tremor   Major  depressive disorder, single episode, unspecified   Gastroesophageal reflux disease   Multiple closed fractures of ribs of right side   Discharge Instructions  Discharge Instructions     Increase activity slowly   Complete by: As directed       Allergies as of 11/25/2024   No Known Allergies      Medication List     STOP taking these medications    cephALEXin  500 MG capsule Commonly known as: Keflex    ciprofloxacin 250 MG tablet Commonly known as: CIPRO   sertraline  100 MG tablet Commonly known as: ZOLOFT        TAKE these medications    acetaminophen  500 MG tablet Commonly known as: TYLENOL  Take 1,000 mg by mouth every 6 (six) hours as needed.   aspirin  81 MG tablet Take 81 mg by mouth daily.   atorvastatin  10 MG tablet Commonly known as: LIPITOR  Take 10 mg by mouth daily. What changed: Another medication with the same name was removed. Continue taking this medication, and follow the directions you see here.   calcium  carbonate 500 MG chewable tablet Commonly known as: TUMS - dosed in mg elemental calcium  Chew 1 tablet by mouth as needed for indigestion or heartburn.   clopidogrel  75 MG tablet Commonly known as: PLAVIX  Take 75 mg by mouth daily.   diclofenac  Sodium 1 % Gel Commonly known as: VOLTAREN  Apply 2 g topically 4 (four) times daily.   loratadine  10 MG tablet Commonly known as: CLARITIN  Take 10 mg by mouth every morning.   metFORMIN 500 MG tablet Commonly known as: GLUCOPHAGE Take 500 mg by mouth daily  with supper.   multivitamin tablet Take 1 tablet by mouth daily.   NON FORMULARY Calcium  600 + D    BID   ondansetron  4 MG disintegrating tablet Commonly known as: ZOFRAN -ODT Take 4 mg by mouth every 8 (eight) hours as needed.   oxycodone  5 MG capsule Commonly known as: OXY-IR Take 1 capsule (5 mg total) by mouth every 4 (four) hours as needed for up to 5 days.   Ozempic (0.25 or 0.5 MG/DOSE) 2 MG/3ML Sopn Generic drug:  Semaglutide(0.25 or 0.5MG /DOS) Inject 0.25 mg as directed once a week. Saturdays   pantoprazole  40 MG tablet Commonly known as: PROTONIX  Take 40 mg by mouth every morning.   PARoxetine  20 MG tablet Commonly known as: PAXIL  Take 20 mg by mouth daily.   propranolol  ER 160 MG SR capsule Commonly known as: INDERAL  LA Take 160 mg by mouth daily.   traZODone  50 MG tablet Commonly known as: DESYREL  Take 50 mg by mouth at bedtime.   venlafaxine 25 MG tablet Commonly known as: EFFEXOR Take 25 mg by mouth at bedtime.        Allergies[1]  Discharge Exam: Vitals:   11/25/24 0400 11/25/24 0423  BP: 117/79   Pulse: 79   Resp: (!) 25   Temp:  98.2 F (36.8 C)  SpO2: 98%     Physical Exam Vitals and nursing note reviewed.  Constitutional:      General: She is not in acute distress.    Appearance: She is ill-appearing.     Comments: frail  HENT:     Head: Normocephalic and atraumatic.  Cardiovascular:     Rate and Rhythm: Normal rate and regular rhythm.     Pulses: Normal pulses.     Heart sounds: Normal heart sounds.  Pulmonary:     Effort: Pulmonary effort is normal. No respiratory distress.     Breath sounds: Normal breath sounds. No wheezing.  Abdominal:     General: Bowel sounds are normal.     Palpations: Abdomen is soft.  Neurological:     Mental Status: She is alert. Mental status is at baseline.     The results of significant diagnostics from this hospitalization (including imaging, microbiology, ancillary and laboratory) are listed below for reference.    Microbiology: Recent Results (from the past 240 hours)  Resp panel by RT-PCR (RSV, Flu A&B, Covid) Anterior Nasal Swab     Status: None   Collection Time: 11/24/24  4:56 PM   Specimen: Anterior Nasal Swab  Result Value Ref Range Status   SARS Coronavirus 2 by RT PCR NEGATIVE NEGATIVE Final    Comment: (NOTE) SARS-CoV-2 target nucleic acids are NOT DETECTED.  The SARS-CoV-2 RNA is generally  detectable in upper respiratory specimens during the acute phase of infection. The lowest concentration of SARS-CoV-2 viral copies this assay can detect is 138 copies/mL. A negative result does not preclude SARS-Cov-2 infection and should not be used as the sole basis for treatment or other patient management decisions. A negative result may occur with  improper specimen collection/handling, submission of specimen other than nasopharyngeal swab, presence of viral mutation(s) within the areas targeted by this assay, and inadequate number of viral copies(<138 copies/mL). A negative result must be combined with clinical observations, patient history, and epidemiological information. The expected result is Negative.  Fact Sheet for Patients:  bloggercourse.com  Fact Sheet for Healthcare Providers:  seriousbroker.it  This test is no t yet approved or cleared by the [place]  FDA  and  has been authorized for detection and/or diagnosis of SARS-CoV-2 by FDA under an Emergency Use Authorization (EUA). This EUA will remain  in effect (meaning this test can be used) for the duration of the COVID-19 declaration under Section 564(b)(1) of the Act, 21 U.S.C.section 360bbb-3(b)(1), unless the authorization is terminated  or revoked sooner.       Influenza A by PCR NEGATIVE NEGATIVE Final   Influenza B by PCR NEGATIVE NEGATIVE Final    Comment: (NOTE) The Xpert Xpress SARS-CoV-2/FLU/RSV plus assay is intended as an aid in the diagnosis of influenza from Nasopharyngeal swab specimens and should not be used as a sole basis for treatment. Nasal washings and aspirates are unacceptable for Xpert Xpress SARS-CoV-2/FLU/RSV testing.  Fact Sheet for Patients: bloggercourse.com  Fact Sheet for Healthcare Providers: seriousbroker.it  This test is not yet approved or cleared by the [place]  FDA  and has been authorized for detection and/or diagnosis of SARS-CoV-2 by FDA under an Emergency Use Authorization (EUA). This EUA will remain in effect (meaning this test can be used) for the duration of the COVID-19 declaration under Section 564(b)(1) of the Act, 21 U.S.C. section 360bbb-3(b)(1), unless the authorization is terminated or revoked.     Resp Syncytial Virus by PCR NEGATIVE NEGATIVE Final    Comment: (NOTE) Fact Sheet for Patients: bloggercourse.com  Fact Sheet for Healthcare Providers: seriousbroker.it  This test is not yet approved or cleared by the [place]  FDA and has been authorized for detection and/or diagnosis of SARS-CoV-2 by FDA under an Emergency Use Authorization (EUA). This EUA will remain in effect (meaning this test can be used) for the duration of the COVID-19 declaration under Section 564(b)(1) of the Act, 21 U.S.C. section 360bbb-3(b)(1), unless the authorization is terminated or revoked.  Performed at [facility], [address] [place], [place] [address]      Labs: ProBNP, BNP (last 5 results) No results for input(s): PROBNP, BNP in the last 8760 hours. Basic Metabolic Panel: Recent Labs  Lab 11/24/24 1656 11/25/24 0249  NA 137 135  K 4.0 4.6  CL 101 99  CO2 25 21*  GLUCOSE 171* 181*  BUN 18 13  CREATININE 0.85 0.82  CALCIUM  10.6* 9.7  MG 1.6*  --    Liver Function Tests: No results for input(s): AST, ALT, ALKPHOS, BILITOT, PROT, ALBUMIN in the last 168 hours. No results for input(s): LIPASE, AMYLASE in the last 168 hours. No results for input(s): AMMONIA in the last 168 hours. CBC: Recent Labs  Lab 11/24/24 1656 11/25/24 0249  WBC 6.6 5.7  NEUTROABS 4.1  --   HGB 13.1 12.4  HCT 38.8 36.7  MCV 96.5 95.1  PLT 295 300   Cardiac Enzymes: No results for input(s): CKTOTAL, CKMB, CKMBINDEX, TROPONINI, TROPONINIHS in the last 168  hours. BNP: No results for input(s): BNP in the last 168 hours. CBG: Recent Labs  Lab 11/24/24 2216 11/25/24 0810  GLUCAP 176* 243*   D-Dimer No results for input(s): DDIMER in the last 72 hours. Hgb A1c Recent Labs    11/24/24 1656  HGBA1C 8.4*   Lipid Profile No results for input(s): CHOL, HDL, LDLCALC, TRIG, CHOLHDL, LDLDIRECT in the last 72 hours. Thyroid  function studies No results for input(s): TSH, T4TOTAL, FREET4, T3FREE, THYROIDAB in the last 72 hours.  Invalid input(s): FREET3 Anemia work up No results for input(s): VITAMINB12, FOLATE, FERRITIN, TIBC, IRON, RETICCTPCT in the last 72 hours. Urinalysis    Component Value Date/Time  COLORURINE AMBER (A) 02/07/2023 1648   APPEARANCEUR Cloudy (A) 02/15/2023 1241   LABSPEC 1.021 02/07/2023 1648   PHURINE 5.0 02/07/2023 1648   GLUCOSEU 2+ (A) 02/15/2023 1241   HGBUR LARGE (A) 02/07/2023 1648   BILIRUBINUR Negative 02/15/2023 1241   KETONESUR NEGATIVE 02/07/2023 1648   PROTEINUR 3+ (A) 02/15/2023 1241   PROTEINUR 100 (A) 02/07/2023 1648   NITRITE Negative 02/15/2023 1241   NITRITE POSITIVE (A) 02/07/2023 1648   LEUKOCYTESUR Trace (A) 02/15/2023 1241   LEUKOCYTESUR SMALL (A) 02/07/2023 1648   Sepsis Labs Recent Labs  Lab 11/24/24 1656 11/25/24 0249  WBC 6.6 5.7    Procedures/Studies: DG Chest 2 View Result Date: 11/25/2024 EXAM: 2 VIEW(S) XRAY OF THE CHEST 11/25/2024 07:08:00 AM COMPARISON: 11/24/2024 CLINICAL HISTORY: The patient presents with pneumothorax. ICD10 code: J93.9 - Pneumothorax, unspecified. FINDINGS: LUNGS AND PLEURA: Stable small right apical pneumothorax. Bibasilar atelectasis. Trace pleural effusions. No focal pulmonary opacity. HEART AND MEDIASTINUM: Median sternotomy and mitral valve replacement noted. No acute abnormality of the cardiac and mediastinal silhouettes. BONES AND SOFT TISSUES: Unchanged right lateral rib fractures. IMPRESSION: 1. Stable  small right apical pneumothorax. 2. Right lateral rib fractures. 3. Bibasilar atelectasis. 4. Trace pleural effusions. Electronically signed by: Waddell Calk MD 11/25/2024 07:17 AM EST RP Workstation: [id number]   CT HEAD WO CONTRAST ( ) Result Date: 11/24/2024 EXAM: CT HEAD WITHOUT CONTRAST 11/24/2024 11:10:24 PM TECHNIQUE: CT of the head was performed without the administration of intravenous contrast. Automated exposure control, iterative reconstruction, and/or weight based adjustment of the mA/kV was utilized to reduce the radiation dose to as low as reasonably achievable. COMPARISON: 11/22/2022 CLINICAL HISTORY: The patient reports memory loss. FINDINGS: BRAIN AND VENTRICLES: No acute hemorrhage. No evidence of acute infarct. No hydrocephalus. No extra-axial collection. No mass effect or midline shift. Periventricular white matter changes, likely sequela of chronic small vessel ischemic disease. Advanced cerebral volume loss for age. ORBITS: No acute abnormality. SINUSES: No acute abnormality. SOFT TISSUES AND SKULL: No acute soft tissue abnormality. No skull fracture. Hyperostosis frontalis. IMPRESSION: 1. No acute intracranial abnormality. Electronically signed by: Morgane Naveau MD 11/24/2024 11:16 PM EST RP Workstation: [id number]   CT Chest W Contrast Result Date: 11/24/2024 EXAM: CT CHEST WITH CONTRAST 11/24/2024 06:46:13 PM TECHNIQUE: CT of the chest was performed with the administration of 75 mL of iohexol  (OMNIPAQUE ) 300 MG/ML solution. Multiplanar reformatted images are provided for review. Automated exposure control, iterative reconstruction, and/or weight based adjustment of the mA/kV was utilized to reduce the radiation dose to as low as reasonably achievable. COMPARISON: None available. CLINICAL HISTORY: Chest trauma, blunt. Right apical pneumothorax involving less than 20% of the lung. FINDINGS: MEDIASTINUM: Status post mitral valve replacement. Mild cardiomegaly. Mild atherosclerotic  calcification within the thoracic aorta. The central airways are clear. LYMPH NODES: No mediastinal, hilar or axillary lymphadenopathy. LUNGS AND PLEURA: Small right apical pneumothorax. No CT evidence of tension physiology. Bibasilar atelectasis. No superimposed pleural pulmonary infiltrate. No central obstructing lesion. Trace right pleural effusion. No pulmonary edema. SOFT TISSUES/BONES: Mildly displaced acute fractures of the right 5 - 8 ribs are noted laterally. UPPER ABDOMEN: Cholelithiasis. Limited images of the upper abdomen demonstrates no acute abnormality. IMPRESSION: 1. Mildly displaced acute fractures of the right 5-8 ribs laterally. 2. Small right apical pneumothorax without CT evidence of tension physiology. Trace right pleural effusion. 3. Status post mitral valve replacement. Mild cardiomegaly. 4. raf score - Aortic atherosclerosis (icd10-i70.0). Electronically signed by: Dorethia Molt MD 11/24/2024 07:02 PM EST RP Workstation: [id number]  DG Ribs Unilateral W/Chest Right Result Date: 11/24/2024 CLINICAL DATA:  Right rib pain. EXAM: RIGHT RIBS AND CHEST - 3+ VIEW COMPARISON:  Chest radiograph dated 03/12/2005. FINDINGS: Right 4th-6th rib fractures. There is a small right apical pneumothorax. No focal consolidation or pleural effusion. The cardiac silhouette is within normal limits. Median sternotomy wires and cardiac valve repair. No acute osseous pathology. IMPRESSION: Right 4th-6th rib fractures with small right apical pneumothorax. These results were called by telephone at the time of interpretation on 11/24/2024 at 4:04 pm to [facility] PA, who verbally acknowledged these results. Electronically Signed   By: Vanetta Chou M.D.   On: 11/24/2024 16:17    Time coordinating discharge: 35 mins  SIGNED:  Norval Bar, MD [facility] 11/25/2024, 11:27 AM     [1] No Known Allergies  "

## 2024-11-25 NOTE — ED Notes (Addendum)
" ° °  Brief Progress Note   _____________________________________________________________________________________________________________  Patient Name: Kathleen Frazier Patient DOB: June 01, 1956 Date: @TODAY @      Data: 69 yo female currently awaiting admission to a Progressive bed at [facility].    Action: Reached out to the provider to inquire about the possibility of downgrading the patient to telemetry level of care.    Response:  Dr. Cosette will update the patients level of care to reflect telemetry status.  _____________________________________________________________________________________________________________  [facility] RN Expeditor Lliam Hoh S Auron Tadros Please contact us  directly via secure chat (search for [facility]) or by calling us  at [phone] [facility]).  "

## 2024-11-25 NOTE — Progress Notes (Signed)
 [facility] SURGICAL PROGRESS NOTE (cpt [id number])  Hospital Day(s): 0.   Interval History: Patient seen and examined, no acute events or new complaints overnight. Patient reports she is feeling better but still with right lateral chest wall pain exacerbated with inspiration. No fever, chills, cough. Labs are reassuring. CXR with improvement in right apical pneumothorax.    Review of Systems:  Constitutional: denies fever, chills  HEENT: denies cough or congestion  Respiratory: denies any shortness of breath  Cardiovascular: + CP (MSK); denied palpitation  Gastrointestinal: denies abdominal pain, N/V Genitourinary: denies burning with urination or urinary frequency Musculoskeletal: denies pain, decreased motor or sensation  Vital signs in last 24 hours: [min-max] current  Temp:  [98.1 F (36.7 C)-98.7 F (37.1 C)] 98.2 F (36.8 C) (01/14 0423) Pulse Rate:  [79-89] 79 (01/14 0400) Resp:  [15-25] 25 (01/14 0400) BP: (113-149)/(79-102) 117/79 (01/14 0400) SpO2:  [93 %-100 %] 98 % (01/14 0400) Weight:  [54 kg] 54 kg (01/13 1727)     Height: 5' 2 (157.5 cm) Weight: 54 kg BMI (Calculated): 21.77   Intake/Output last 2 shifts:  No intake/output data recorded.   Physical Exam:  Constitutional: alert, cooperative and no distress  HENT: normocephalic without obvious abnormality  Eyes: PERRL, EOM's grossly intact and symmetric  Respiratory: breathing non-labored at rest; slightly diminished in right apex. Chest wall tenderness on the right  Cardiovascular: regular rate and sinus rhythm   Labs:     Latest Ref Rng & Units 11/25/2024    2:49 AM 11/24/2024    4:56 PM 02/09/2023    4:03 AM  CBC  WBC 4.0 - 10.5 K/uL 5.7  6.6  13.3   Hemoglobin 12.0 - 15.0 g/dL 87.5  86.8  88.6   Hematocrit 36.0 - 46.0 % 36.7  38.8  33.8   Platelets 150 - 400 K/uL 300  295  155       Latest Ref Rng & Units 11/25/2024    2:49 AM 11/24/2024    4:56 PM 02/09/2023    4:03 AM  CMP  Glucose 70  - 99 mg/dL 818  828  838   BUN 8 - 23 mg/dL 13  18  25    Creatinine 0.44 - 1.00 mg/dL 9.17  9.14  8.98   Sodium 135 - 145 mmol/L 135  137  136   Potassium 3.5 - 5.1 mmol/L 4.6  4.0  3.7   Chloride 98 - 111 mmol/L 99  101  103   CO2 22 - 32 mmol/L 21  25  20    Calcium  8.9 - 10.3 mg/dL 9.7  89.3  8.3     Imaging studies:   CXR (11/25/2024) personally reviewed with stable, to decreased, small right apical pneumothorax, and radiologist report reviewed below:  IMPRESSION: 1. Stable small right apical pneumothorax. 2. Right lateral rib fractures. 3. Bibasilar atelectasis. 4. Trace pleural effusions.   Assessment/Plan: (ICD-10's: J93.9) 69 y.o. female with right apical pneumothorax after reported fall with right sided rib fractures    - CXR appears stable vs improved. No need for chest tube placement  - She will benefit from incentive spirometer; pulmonary toilet  - Pain control   - Okay to mobilize   - Further management per primary service; We will remain available should condition change  All of the above findings and recommendations were discussed with the patient, patient's family (son at bedside), and the medical team, and all of patient's and family's questions were answered to their expressed  satisfaction.  -- Arthea Platt, PA-C [facility] 11/25/2024, 8:46 AM M-F: 7am - 4pm
